# Patient Record
Sex: Female | Born: 1990 | Race: Asian | Hispanic: No | Marital: Single | Smoking: Never smoker
Health system: Southern US, Community
[De-identification: ages and names within clinical notes are randomized; demographics above are authoritative.]

## PROBLEM LIST (undated history)

## (undated) DIAGNOSIS — Z789 Other specified health status: Secondary | ICD-10-CM

## (undated) HISTORY — PX: NO PAST SURGERIES: SHX2092

---

## 2012-06-07 LAB — OB RESULTS CONSOLE ABO/RH: RH TYPE: POSITIVE

## 2012-06-07 LAB — OB RESULTS CONSOLE VARICELLA ZOSTER ANTIBODY, IGG: VARICELLA IGG: IMMUNE

## 2014-03-21 NOTE — L&D Delivery Note (Cosign Needed)
Delivery Note   The patient was admitted 11/19 complaining of PROM 11/18 at approximately 10:00 AM. Augmentation with pitocin. No signs/symptoms triple I. Pushed for approximately 30 minutes. Category 1 fetal heart tracing essentially throughout. At 1:58 PM a viable female was delivered via Vaginal, Spontaneous Delivery (Presentation: Left Occiput Anterior).  APGAR: 8, 9; weight 3187 g.   Placenta status: Intact, Spontaneous.  Cord: 3 vessels with the following complications: trailing membrane manually removed.  Cord pH: not obtained  Anesthesia: Epidural  Episiotomy: None Lacerations: 2nd degree (partial) Suture Repair: 3.0 vicryl Est. Blood Loss (mL):  300 ml  Mom to postpartum.  Baby to Couplet care / Skin to Skin.  Karen Smith 02/07/2015, 2:25 PM

## 2014-06-18 LAB — OB RESULTS CONSOLE HGB/HCT, BLOOD
HEMATOCRIT: 35 %
HEMOGLOBIN: 11.5 g/dL

## 2014-07-14 LAB — OB RESULTS CONSOLE RPR: RPR: NONREACTIVE

## 2014-07-14 LAB — OB RESULTS CONSOLE GC/CHLAMYDIA
Chlamydia: NEGATIVE
Gonorrhea: NEGATIVE

## 2014-07-14 LAB — OB RESULTS CONSOLE ANTIBODY SCREEN: ANTIBODY SCREEN: NEGATIVE

## 2014-07-14 LAB — OB RESULTS CONSOLE HEPATITIS B SURFACE ANTIGEN: Hepatitis B Surface Ag: NEGATIVE

## 2014-07-14 LAB — CULTURE, OB URINE: URINE CULTURE, OB: NEGATIVE

## 2014-07-14 LAB — CYTOLOGY - PAP: CYTOLOGY - PAP: NEGATIVE

## 2014-07-14 LAB — OB RESULTS CONSOLE RUBELLA ANTIBODY, IGM: Rubella: IMMUNE

## 2014-08-11 LAB — AFP MATERNAL TRIPLE SCREEN: AFP, Serum: NEGATIVE

## 2014-08-11 LAB — OB RESULTS CONSOLE HIV ANTIBODY (ROUTINE TESTING): HIV: NONREACTIVE

## 2014-08-11 LAB — SICKLE CELL SCREEN

## 2014-11-17 ENCOUNTER — Encounter: Payer: Self-pay | Admitting: *Deleted

## 2014-11-17 ENCOUNTER — Ambulatory Visit (INDEPENDENT_AMBULATORY_CARE_PROVIDER_SITE_OTHER): Payer: Medicaid Other | Admitting: Family Medicine

## 2014-11-17 ENCOUNTER — Encounter: Payer: Self-pay | Admitting: Family Medicine

## 2014-11-17 VITALS — BP 118/69 | HR 87 | Temp 98.5°F | Ht <= 58 in | Wt 117.3 lb

## 2014-11-17 DIAGNOSIS — O09629 Supervision of young multigravida, unspecified trimester: Secondary | ICD-10-CM | POA: Insufficient documentation

## 2014-11-17 DIAGNOSIS — Z3493 Encounter for supervision of normal pregnancy, unspecified, third trimester: Secondary | ICD-10-CM

## 2014-11-17 DIAGNOSIS — O09622 Supervision of young multigravida, second trimester: Secondary | ICD-10-CM | POA: Diagnosis not present

## 2014-11-17 DIAGNOSIS — Z23 Encounter for immunization: Secondary | ICD-10-CM | POA: Diagnosis not present

## 2014-11-17 DIAGNOSIS — IMO0002 Reserved for concepts with insufficient information to code with codable children: Secondary | ICD-10-CM | POA: Insufficient documentation

## 2014-11-17 DIAGNOSIS — Q897 Multiple congenital malformations, not elsewhere classified: Secondary | ICD-10-CM

## 2014-11-17 LAB — CBC
HEMATOCRIT: 32.7 % — AB (ref 36.0–46.0)
HEMOGLOBIN: 10.7 g/dL — AB (ref 12.0–15.0)
MCH: 23.2 pg — ABNORMAL LOW (ref 26.0–34.0)
MCHC: 32.7 g/dL (ref 30.0–36.0)
MCV: 70.8 fL — ABNORMAL LOW (ref 78.0–100.0)
Platelets: 169 10*3/uL (ref 150–400)
RBC: 4.62 MIL/uL (ref 3.87–5.11)
RDW: 14 % (ref 11.5–15.5)
WBC: 12.4 10*3/uL — AB (ref 4.0–10.5)

## 2014-11-17 LAB — POCT URINALYSIS DIP (DEVICE)
Bilirubin Urine: NEGATIVE
Glucose, UA: NEGATIVE mg/dL
HGB URINE DIPSTICK: NEGATIVE
Ketones, ur: NEGATIVE mg/dL
Leukocytes, UA: NEGATIVE
NITRITE: NEGATIVE
PH: 7 (ref 5.0–8.0)
PROTEIN: NEGATIVE mg/dL
Specific Gravity, Urine: 1.015 (ref 1.005–1.030)
Urobilinogen, UA: 0.2 mg/dL (ref 0.0–1.0)

## 2014-11-17 LAB — GLUCOSE TOLERANCE, 1 HOUR (50G) W/O FASTING: Glucose, 1 Hour GTT: 106 mg/dL (ref 70–140)

## 2014-11-17 MED ORDER — TETANUS-DIPHTH-ACELL PERTUSSIS 5-2.5-18.5 LF-MCG/0.5 IM SUSP
0.5000 mL | Freq: Once | INTRAMUSCULAR | Status: AC
  Administered 2014-11-17: 0.5 mL via INTRAMUSCULAR

## 2014-11-17 NOTE — Progress Notes (Signed)
Initial OB appointment Flu vaccine/tdap vaccine 28 wks labs with 1 hour gtt New/28 wk educational material given

## 2014-11-17 NOTE — Patient Instructions (Signed)
Third Trimester of Pregnancy The third trimester is from week 29 through week 42, months 7 through 9. The third trimester is a time when the fetus is growing rapidly. At the end of the ninth month, the fetus is about 20 inches in length and weighs 6-10 pounds.  BODY CHANGES Your body goes through many changes during pregnancy. The changes vary from woman to woman.   Your weight will continue to increase. You can expect to gain 25-35 pounds (11-16 kg) by the end of the pregnancy.  You may begin to get stretch marks on your hips, abdomen, and breasts.  You may urinate more often because the fetus is moving lower into your pelvis and pressing on your bladder.  You may develop or continue to have heartburn as a result of your pregnancy.  You may develop constipation because certain hormones are causing the muscles that push waste through your intestines to slow down.  You may develop hemorrhoids or swollen, bulging veins (varicose veins).  You may have pelvic pain because of the weight gain and pregnancy hormones relaxing your joints between the bones in your pelvis. Backaches may result from overexertion of the muscles supporting your posture.  You may have changes in your hair. These can include thickening of your hair, rapid growth, and changes in texture. Some women also have hair loss during or after pregnancy, or hair that feels dry or thin. Your hair will most likely return to normal after your baby is born.  Your breasts will continue to grow and be tender. A yellow discharge may leak from your breasts called colostrum.  Your belly button may stick out.  You may feel short of breath because of your expanding uterus.  You may notice the fetus "dropping," or moving lower in your abdomen.  You may have a bloody mucus discharge. This usually occurs a few days to a week before labor begins.  Your cervix becomes thin and soft (effaced) near your due date. WHAT TO EXPECT AT YOUR PRENATAL  EXAMS  You will have prenatal exams every 2 weeks until week 36. Then, you will have weekly prenatal exams. During a routine prenatal visit:  You will be weighed to make sure you and the fetus are growing normally.  Your blood pressure is taken.  Your abdomen will be measured to track your baby's growth.  The fetal heartbeat will be listened to.  Any test results from the previous visit will be discussed.  You may have a cervical check near your due date to see if you have effaced. At around 36 weeks, your caregiver will check your cervix. At the same time, your caregiver will also perform a test on the secretions of the vaginal tissue. This test is to determine if a type of bacteria, Group B streptococcus, is present. Your caregiver will explain this further. Your caregiver may ask you:  What your birth plan is.  How you are feeling.  If you are feeling the baby move.  If you have had any abnormal symptoms, such as leaking fluid, bleeding, severe headaches, or abdominal cramping.  If you have any questions. Other tests or screenings that may be performed during your third trimester include:  Blood tests that check for low iron levels (anemia).  Fetal testing to check the health, activity level, and growth of the fetus. Testing is done if you have certain medical conditions or if there are problems during the pregnancy. FALSE LABOR You may feel small, irregular contractions that   eventually go away. These are called Braxton Hicks contractions, or false labor. Contractions may last for hours, days, or even weeks before true labor sets in. If contractions come at regular intervals, intensify, or become painful, it is best to be seen by your caregiver.  SIGNS OF LABOR   Menstrual-like cramps.  Contractions that are 5 minutes apart or less.  Contractions that start on the top of the uterus and spread down to the lower abdomen and back.  A sense of increased pelvic pressure or back  pain.  A watery or bloody mucus discharge that comes from the vagina. If you have any of these signs before the 37th week of pregnancy, call your caregiver right away. You need to go to the hospital to get checked immediately. HOME CARE INSTRUCTIONS   Avoid all smoking, herbs, alcohol, and unprescribed drugs. These chemicals affect the formation and growth of the baby.  Follow your caregiver's instructions regarding medicine use. There are medicines that are either safe or unsafe to take during pregnancy.  Exercise only as directed by your caregiver. Experiencing uterine cramps is a good sign to stop exercising.  Continue to eat regular, healthy meals.  Wear a good support bra for breast tenderness.  Do not use hot tubs, steam rooms, or saunas.  Wear your seat belt at all times when driving.  Avoid raw meat, uncooked cheese, cat litter boxes, and soil used by cats. These carry germs that can cause birth defects in the baby.  Take your prenatal vitamins.  Try taking a stool softener (if your caregiver approves) if you develop constipation. Eat more high-fiber foods, such as fresh vegetables or fruit and whole grains. Drink plenty of fluids to keep your urine clear or pale yellow.  Take warm sitz baths to soothe any pain or discomfort caused by hemorrhoids. Use hemorrhoid cream if your caregiver approves.  If you develop varicose veins, wear support hose. Elevate your feet for 15 minutes, 3-4 times a day. Limit salt in your diet.  Avoid heavy lifting, wear low heal shoes, and practice good posture.  Rest a lot with your legs elevated if you have leg cramps or low back pain.  Visit your dentist if you have not gone during your pregnancy. Use a soft toothbrush to brush your teeth and be gentle when you floss.  A sexual relationship may be continued unless your caregiver directs you otherwise.  Do not travel far distances unless it is absolutely necessary and only with the approval  of your caregiver.  Take prenatal classes to understand, practice, and ask questions about the labor and delivery.  Make a trial run to the hospital.  Pack your hospital bag.  Prepare the baby's nursery.  Continue to go to all your prenatal visits as directed by your caregiver. SEEK MEDICAL CARE IF:  You are unsure if you are in labor or if your water has broken.  You have dizziness.  You have mild pelvic cramps, pelvic pressure, or nagging pain in your abdominal area.  You have persistent nausea, vomiting, or diarrhea.  You have a bad smelling vaginal discharge.  You have pain with urination. SEEK IMMEDIATE MEDICAL CARE IF:   You have a fever.  You are leaking fluid from your vagina.  You have spotting or bleeding from your vagina.  You have severe abdominal cramping or pain.  You have rapid weight loss or gain.  You have shortness of breath with chest pain.  You notice sudden or extreme swelling   of your face, hands, ankles, feet, or legs.  You have not felt your baby move in over an hour.  You have severe headaches that do not go away with medicine.  You have vision changes. Document Released: 03/01/2001 Document Revised: 03/12/2013 Document Reviewed: 05/08/2012 Exit[employer] Patient Information 2015 [employer]. This information is not intended to replace advice given to you by your health care provider. Make sure you discuss any questions you have with your health care provider.  

## 2014-11-17 NOTE — Progress Notes (Signed)
Ultrasound scheduled for 12/15/2014 @ 10:00 AM with MFM

## 2014-11-17 NOTE — Progress Notes (Signed)
Subjective:  Karen Smith is a 24 y.o. G2P1001 at [redacted]w[redacted]d being seen today for ongoing prenatal care.  Patient reports no complaints.  Contractions: Not present.  Vag. Bleeding: None. Denies leaking of fluid.   The following portions of the patient's history were reviewed and updated as appropriate: allergies, current medications, past family history, past medical history, past social history, past surgical history and problem list.   Objective:   Filed Vitals:   11/17/14 0824 11/17/14 0905  BP: 118/69   Pulse: 87   Temp: 98.5 F (36.9 C)   Height:   (1.448 m)  Weight: 117 lb 4.8 oz (53.207 kg)     Fetal Status: Fetal Heart Rate (bpm): 156 Fundal Height: 29 cm       General:  Alert, oriented and cooperative. Patient is in no acute distress.  Skin: Skin is warm and dry. No rash noted.   Cardiovascular: Normal heart rate noted  Respiratory: Normal respiratory effort, no problems with respiration noted  Abdomen: Soft, gravid, appropriate for gestational age. Pain/Pressure: Absent     Pelvic: Vag. Bleeding: None     Cervical exam deferred        Extremities: Normal range of motion.  Edema: None  Mental Status: Normal mood and affect. Normal behavior. Normal judgment and thought content.   Urinalysis: Urine Protein: Negative Urine Glucose: Negative  Assessment and Plan:  Pregnancy: G2P1001 at [redacted]w[redacted]d  1. Prenatal care in third trimester - Glucose Tolerance, 1 HR (50g) w/o Fasting - CBC - RPR - HIV antibody (with reflex)  2. Flu vaccine need - Flu Vaccine QUAD 36+ mos IM; Standing - Flu Vaccine QUAD 36+ mos IM  3. Need for Tdap vaccination - Tdap (BOOSTRIX) injection 0.5 mL; Inject 0.5 mLs into the muscle once.  4. Supervision of young multigravida, antepartum, third trimester Added pregnancy box Reviewed PTL signs and sx   5. Concern for microcephaly- noted by previous provider based on HC 24.52 and AC=22.36. Of note mother is 4'9" and father is 56"3 and last pregnancy  infant was 5#10oz - Ordered Level II follow up US in 4 weeks  Preterm labor symptoms and general obstetric precautions including but not limited to vaginal bleeding, contractions, leaking of fluid and fetal movement were reviewed in detail with the patient. Please refer to After Visit Summary for other counseling recommendations.  Return in about 2 weeks (around 12/01/2014) for Routine prenatal care.   Federico Flake, MD  Future Appointments Date Time Provider Department Center  12/01/2014 9:30 AM Kathrynn Running, MD [facility]  12/15/2014 10:00 AM [facility] [place] 1 [facility]

## 2014-11-17 NOTE — Progress Notes (Signed)
Nutrition Note: 1st visit consult Pt has gained 17.3# @ [redacted]w[redacted]d, which is wnl.  Pt reports eating 4-5 meal/snacks daily.  Pt is taking PNV.  Pt reports no N & V or heartburn. NKFA.  Pt received verbal and written education on general nutrition during pregnancy. Discussed weight gain goals of 25-35 lbs or 1 lb per week. Pt agrees to continue to take PNV. Pt has WIC.  F/u as needed. Lupe Carney Matznick MS,RD,LDN

## 2014-11-18 LAB — RPR

## 2014-11-18 LAB — HIV ANTIBODY (ROUTINE TESTING W REFLEX): HIV: NONREACTIVE

## 2014-12-01 ENCOUNTER — Ambulatory Visit (INDEPENDENT_AMBULATORY_CARE_PROVIDER_SITE_OTHER): Payer: Medicaid Other | Admitting: Obstetrics and Gynecology

## 2014-12-01 VITALS — BP 125/65 | HR 92 | Temp 98.1°F | Wt 119.2 lb

## 2014-12-01 DIAGNOSIS — Z3493 Encounter for supervision of normal pregnancy, unspecified, third trimester: Secondary | ICD-10-CM | POA: Diagnosis not present

## 2014-12-01 DIAGNOSIS — IMO0002 Reserved for concepts with insufficient information to code with codable children: Secondary | ICD-10-CM

## 2014-12-01 DIAGNOSIS — O09623 Supervision of young multigravida, third trimester: Secondary | ICD-10-CM

## 2014-12-01 LAB — POCT URINALYSIS DIP (DEVICE)
Bilirubin Urine: NEGATIVE
Glucose, UA: NEGATIVE mg/dL
HGB URINE DIPSTICK: NEGATIVE
KETONES UR: NEGATIVE mg/dL
Leukocytes, UA: NEGATIVE
Nitrite: NEGATIVE
PH: 7 (ref 5.0–8.0)
PROTEIN: NEGATIVE mg/dL
Specific Gravity, Urine: 1.02 (ref 1.005–1.030)
Urobilinogen, UA: 0.2 mg/dL (ref 0.0–1.0)

## 2014-12-01 NOTE — Progress Notes (Signed)
Subjective:  Karen Smith is a 24 y.o. G2P1001 at [redacted]w[redacted]d being seen today for ongoing prenatal care.  Patient reports no complaints.  Contractions: Not present.  Vag. Bleeding: None. Movement: Present. Denies leaking of fluid.   The following portions of the patient's history were reviewed and updated as appropriate: allergies, current medications, past family history, past medical history, past social history, past surgical history and problem list.   Objective:   Filed Vitals:   12/01/14 1006  BP: 125/65  Pulse: 92  Temp: 98.1 F (36.7 C)  Weight: 119 lb 3.2 oz (54.069 kg)    Fetal Status: Fetal Heart Rate (bpm): 143 Fundal Height: 29 cm Movement: Present     General:  Alert, oriented and cooperative. Patient is in no acute distress.  Skin: Skin is warm and dry. No rash noted.   Cardiovascular: Normal heart rate noted  Respiratory: Normal respiratory effort, no problems with respiration noted  Abdomen: Soft, gravid, appropriate for gestational age. Pain/Pressure: Absent     Pelvic: Vag. Bleeding: None     Cervical exam deferred        Extremities: Normal range of motion.  Edema: None  Mental Status: Normal mood and affect. Normal behavior. Normal judgment and thought content.   Urinalysis: Urine Protein: Negative Urine Glucose: Negative  Assessment and Plan:  Pregnancy: G2P1001 at [redacted]w[redacted]d  1. Supervision of young multigravida, antepartum, third trimester - continue pnv  # Concern for microcephaly - u/s scheduled for 9/26  Preterm labor symptoms and general obstetric precautions including but not limited to vaginal bleeding, contractions, leaking of fluid and fetal movement were reviewed in detail with the patient. Please refer to After Visit Summary for other counseling recommendations.  Return in about 2 weeks (around 12/15/2014).   Kathrynn Running, MD

## 2014-12-01 NOTE — Progress Notes (Signed)
Reviewed tip of week with patient  

## 2014-12-15 ENCOUNTER — Encounter (HOSPITAL_COMMUNITY): Payer: Self-pay

## 2014-12-15 ENCOUNTER — Other Ambulatory Visit: Payer: Self-pay | Admitting: Family Medicine

## 2014-12-15 ENCOUNTER — Ambulatory Visit (INDEPENDENT_AMBULATORY_CARE_PROVIDER_SITE_OTHER): Payer: Medicaid Other | Admitting: Obstetrics & Gynecology

## 2014-12-15 ENCOUNTER — Ambulatory Visit (HOSPITAL_COMMUNITY)
Admission: RE | Admit: 2014-12-15 | Discharge: 2014-12-15 | Disposition: A | Discharge status: Home / Self Care | Payer: Medicaid Other | Source: Ambulatory Visit | Attending: Family Medicine | Admitting: Family Medicine

## 2014-12-15 VITALS — BP 120/79 | HR 87 | Wt 124.4 lb

## 2014-12-15 VITALS — BP 122/74 | HR 84 | Temp 98.5°F | Wt 123.4 lb

## 2014-12-15 DIAGNOSIS — Z0489 Encounter for examination and observation for other specified reasons: Secondary | ICD-10-CM

## 2014-12-15 DIAGNOSIS — Z3A33 33 weeks gestation of pregnancy: Secondary | ICD-10-CM | POA: Insufficient documentation

## 2014-12-15 DIAGNOSIS — O350XX0 Maternal care for (suspected) central nervous system malformation in fetus, not applicable or unspecified: Secondary | ICD-10-CM | POA: Diagnosis not present

## 2014-12-15 DIAGNOSIS — Z36 Encounter for antenatal screening of mother: Secondary | ICD-10-CM | POA: Insufficient documentation

## 2014-12-15 DIAGNOSIS — O09623 Supervision of young multigravida, third trimester: Secondary | ICD-10-CM

## 2014-12-15 DIAGNOSIS — O289 Unspecified abnormal findings on antenatal screening of mother: Secondary | ICD-10-CM

## 2014-12-15 DIAGNOSIS — O283 Abnormal ultrasonic finding on antenatal screening of mother: Secondary | ICD-10-CM | POA: Insufficient documentation

## 2014-12-15 DIAGNOSIS — IMO0001 Reserved for inherently not codable concepts without codable children: Secondary | ICD-10-CM

## 2014-12-15 DIAGNOSIS — Z3689 Encounter for other specified antenatal screening: Secondary | ICD-10-CM

## 2014-12-15 DIAGNOSIS — IMO0002 Reserved for concepts with insufficient information to code with codable children: Secondary | ICD-10-CM

## 2014-12-15 LAB — POCT URINALYSIS DIP (DEVICE)
BILIRUBIN URINE: NEGATIVE
Glucose, UA: NEGATIVE mg/dL
HGB URINE DIPSTICK: NEGATIVE
KETONES UR: NEGATIVE mg/dL
LEUKOCYTES UA: NEGATIVE
NITRITE: NEGATIVE
PH: 7.5 (ref 5.0–8.0)
Protein, ur: NEGATIVE mg/dL
SPECIFIC GRAVITY, URINE: 1.015 (ref 1.005–1.030)
Urobilinogen, UA: 0.2 mg/dL (ref 0.0–1.0)

## 2014-12-15 NOTE — Progress Notes (Signed)
Subjective:  Karen Smith is a 24 y.o. G2P1001 at [redacted]w[redacted]d being seen today for ongoing prenatal care.  Patient reports backache.  Contractions: Not present.  Vag. Bleeding: None. Movement: Present. Denies leaking of fluid.   The following portions of the patient's history were reviewed and updated as appropriate: allergies, current medications, past family history, past medical history, past social history, past surgical history and problem list.   Objective:   Filed Vitals:   12/15/14 1156  BP: 122/74  Pulse: 84  Temp: 98.5 F (36.9 C)  Weight: 123 lb 6.4 oz (55.974 kg)    Fetal Status: Fetal Heart Rate (bpm): 146 Fundal Height: 32 cm Movement: Present     General:  Alert, oriented and cooperative. Patient is in no acute distress.  Skin: Skin is warm and dry. No rash noted.   Cardiovascular: Normal heart rate noted  Respiratory: Normal respiratory effort, no problems with respiration noted  Abdomen: Soft, gravid, appropriate for gestational age. Pain/Pressure: Present     Pelvic: Vag. Bleeding: None     Cervical exam deferred        Extremities: Normal range of motion.  Edema: None  Mental Status: Normal mood and affect. Normal behavior. Normal judgment and thought content.   Urinalysis: Urine Protein: Negative Urine Glucose: Negative  Assessment and Plan:  Pregnancy: G2P1001 at [redacted]w[redacted]d  1. Fetal anomaly micocephaly--F/U US in 3 weeks  2. Supervision of young multigravida, antepartum, third trimester Contraception encouraged  Preterm labor symptoms and general obstetric precautions including but not limited to vaginal bleeding, contractions, leaking of fluid and fetal movement were reviewed in detail with the patient. Please refer to After Visit Summary for other counseling recommendations.  Return in about 2 weeks (around 12/29/2014).   Lesly Dukes, MD

## 2014-12-15 NOTE — Patient Instructions (Signed)
Levonorgestrel intrauterine device (IUD) What is this medicine? LEVONORGESTREL IUD (LEE voe nor jes trel) is a contraceptive (birth control) device. The device is placed inside the uterus by a healthcare professional. It is used to prevent pregnancy and can also be used to treat heavy bleeding that occurs during your period. Depending on the device, it can be used for 3 to 5 years. This medicine may be used for other purposes; ask your health care provider or pharmacist if you have questions. COMMON BRAND NAME(S): LILETTA, Mirena, Skyla What should I tell my health care provider before I take this medicine? They need to know if you have any of these conditions: -abnormal Pap smear -cancer of the breast, uterus, or cervix -diabetes -endometritis -genital or pelvic infection now or in the past -have more than one sexual partner or your partner has more than one partner -heart disease -history of an ectopic or tubal pregnancy -immune system problems -IUD in place -liver disease or tumor -problems with blood clots or take blood-thinners -use intravenous drugs -uterus of unusual shape -vaginal bleeding that has not been explained -an unusual or allergic reaction to levonorgestrel, other hormones, silicone, or polyethylene, medicines, foods, dyes, or preservatives -pregnant or trying to get pregnant -breast-feeding How should I use this medicine? This device is placed inside the uterus by a health care professional. Talk to your pediatrician regarding the use of this medicine in children. Special care may be needed. Overdosage: If you think you have taken too much of this medicine contact a poison control center or emergency room at once. NOTE: This medicine is only for you. Do not share this medicine with others. What if I miss a dose? This does not apply. What may interact with this medicine? Do not take this medicine with any of the following  medications: -amprenavir -bosentan -fosamprenavir This medicine may also interact with the following medications: -aprepitant -barbiturate medicines for inducing sleep or treating seizures -bexarotene -griseofulvin -medicines to treat seizures like carbamazepine, ethotoin, felbamate, oxcarbazepine, phenytoin, topiramate -modafinil -pioglitazone -rifabutin -rifampin -rifapentine -some medicines to treat HIV infection like atazanavir, indinavir, lopinavir, nelfinavir, tipranavir, ritonavir -St. John's wort -warfarin This list may not describe all possible interactions. Give your health care provider a list of all the medicines, herbs, non-prescription drugs, or dietary supplements you use. Also tell them if you smoke, drink alcohol, or use illegal drugs. Some items may interact with your medicine. What should I watch for while using this medicine? Visit your doctor or health care professional for regular check ups. See your doctor if you or your partner has sexual contact with others, becomes HIV positive, or gets a sexual transmitted disease. This product does not protect you against HIV infection (AIDS) or other sexually transmitted diseases. You can check the placement of the IUD yourself by reaching up to the top of your vagina with clean fingers to feel the threads. Do not pull on the threads. It is a good habit to check placement after each menstrual period. Call your doctor right away if you feel more of the IUD than just the threads or if you cannot feel the threads at all. The IUD may come out by itself. You may become pregnant if the device comes out. If you notice that the IUD has come out use a backup birth control method like condoms and call your health care provider. Using tampons will not change the position of the IUD and are okay to use during your period. What side effects may   I notice from receiving this medicine? Side effects that you should report to your doctor or  health care professional as soon as possible: -allergic reactions like skin rash, itching or hives, swelling of the face, lips, or tongue -fever, flu-like symptoms -genital sores -high blood pressure -no menstrual period for 6 weeks during use -pain, swelling, warmth in the leg -pelvic pain or tenderness -severe or sudden headache -signs of pregnancy -stomach cramping -sudden shortness of breath -trouble with balance, talking, or walking -unusual vaginal bleeding, discharge -yellowing of the eyes or skin Side effects that usually do not require medical attention (report to your doctor or health care professional if they continue or are bothersome): -acne -breast pain -change in sex drive or performance -changes in weight -cramping, dizziness, or faintness while the device is being inserted -headache -irregular menstrual bleeding within first 3 to 6 months of use -nausea This list may not describe all possible side effects. Call your doctor for medical advice about side effects. You may report side effects to FDA at 1-800-FDA-1088. Where should I keep my medicine? This does not apply. NOTE: This sheet is a summary. It may not cover all possible information. If you have questions about this medicine, talk to your doctor, pharmacist, or health care provider.  2015, [employer]. (2011-04-07 13:54:04)  

## 2014-12-15 NOTE — Progress Notes (Signed)
C/o pressure that she has not been having, especially when lying down, denies contractions.

## 2014-12-29 ENCOUNTER — Ambulatory Visit (INDEPENDENT_AMBULATORY_CARE_PROVIDER_SITE_OTHER): Payer: Medicaid Other | Admitting: Obstetrics & Gynecology

## 2014-12-29 ENCOUNTER — Encounter: Payer: Self-pay | Admitting: Obstetrics & Gynecology

## 2014-12-29 VITALS — BP 119/68 | HR 80 | Temp 97.8°F | Wt 125.1 lb

## 2014-12-29 DIAGNOSIS — O09623 Supervision of young multigravida, third trimester: Secondary | ICD-10-CM | POA: Diagnosis present

## 2014-12-29 LAB — POCT URINALYSIS DIP (DEVICE)
Bilirubin Urine: NEGATIVE
Glucose, UA: NEGATIVE mg/dL
HGB URINE DIPSTICK: NEGATIVE
Ketones, ur: NEGATIVE mg/dL
Leukocytes, UA: NEGATIVE
Nitrite: NEGATIVE
PH: 6 (ref 5.0–8.0)
PROTEIN: NEGATIVE mg/dL
SPECIFIC GRAVITY, URINE: 1.015 (ref 1.005–1.030)
UROBILINOGEN UA: 0.2 mg/dL (ref 0.0–1.0)

## 2014-12-29 NOTE — Progress Notes (Signed)
Subjective:wasnt sure of her LMP  Karen Smith is a 24 y.o. G2P1001 at [redacted]w[redacted]d being seen today for ongoing prenatal care.  Patient reports no complaints.  Contractions: Not present.  Vag. Bleeding: None. Movement: Present. Denies leaking of fluid.   The following portions of the patient's history were reviewed and updated as appropriate: allergies, current medications, past family history, past medical history, past social history, past surgical history and problem list.   Objective:   Filed Vitals:   12/29/14 1103  BP: 119/68  Pulse: 80  Temp: 97.8 F (36.6 C)  Weight: 125 lb 1.6 oz (56.745 kg)    Fetal Status: Fetal Heart Rate (bpm): 144   Movement: Present     General:  Alert, oriented and cooperative. Patient is in no acute distress.  Skin: Skin is warm and dry. No rash noted.   Cardiovascular: Normal heart rate noted  Respiratory: Normal respiratory effort, no problems with respiration noted  Abdomen: Soft, gravid, appropriate for gestational age. Pain/Pressure: Absent     Pelvic: Vag. Bleeding: None     Cervical exam deferred        Extremities: Normal range of motion.  Edema: None  Mental Status: Normal mood and affect. Normal behavior. Normal judgment and thought content.   Urinalysis:      Assessment and Plan:  Pregnancy: G2P1001 at [redacted]w[redacted]d  1. Supervision of young multigravida, antepartum, third trimester Doing well   Preterm labor symptoms and general obstetric precautions including but not limited to vaginal bleeding, contractions, leaking of fluid and fetal movement were reviewed in detail with the patient. Please refer to After Visit Summary for other counseling recommendations.  Return in about 1 week (around 01/05/2015).   Adam Phenix, MD

## 2014-12-29 NOTE — Progress Notes (Signed)
Used Interpreter [employer].

## 2014-12-29 NOTE — Patient Instructions (Addendum)

## 2015-01-05 ENCOUNTER — Other Ambulatory Visit (HOSPITAL_COMMUNITY): Payer: Self-pay | Admitting: Maternal and Fetal Medicine

## 2015-01-05 ENCOUNTER — Ambulatory Visit (INDEPENDENT_AMBULATORY_CARE_PROVIDER_SITE_OTHER): Payer: Medicaid Other | Admitting: Obstetrics and Gynecology

## 2015-01-05 ENCOUNTER — Encounter (HOSPITAL_COMMUNITY): Payer: Self-pay

## 2015-01-05 ENCOUNTER — Other Ambulatory Visit (HOSPITAL_COMMUNITY): Payer: Self-pay | Admitting: *Deleted

## 2015-01-05 ENCOUNTER — Ambulatory Visit (HOSPITAL_COMMUNITY)
Admission: RE | Admit: 2015-01-05 | Discharge: 2015-01-05 | Disposition: A | Discharge status: Home / Self Care | Payer: Medicaid Other | Source: Ambulatory Visit | Attending: Family Medicine | Admitting: Family Medicine

## 2015-01-05 VITALS — BP 119/88 | HR 82 | Temp 98.1°F | Wt 126.9 lb

## 2015-01-05 DIAGNOSIS — Z3A35 35 weeks gestation of pregnancy: Secondary | ICD-10-CM

## 2015-01-05 DIAGNOSIS — O3507X0 Maternal care for (suspected) central nervous system malformation or damage in fetus, microcephaly, not applicable or unspecified: Secondary | ICD-10-CM

## 2015-01-05 DIAGNOSIS — Z0489 Encounter for examination and observation for other specified reasons: Secondary | ICD-10-CM

## 2015-01-05 DIAGNOSIS — Z36 Encounter for antenatal screening of mother: Secondary | ICD-10-CM | POA: Diagnosis present

## 2015-01-05 DIAGNOSIS — Q897 Multiple congenital malformations, not elsewhere classified: Secondary | ICD-10-CM

## 2015-01-05 DIAGNOSIS — IMO0002 Reserved for concepts with insufficient information to code with codable children: Secondary | ICD-10-CM

## 2015-01-05 DIAGNOSIS — O365930 Maternal care for other known or suspected poor fetal growth, third trimester, not applicable or unspecified: Secondary | ICD-10-CM

## 2015-01-05 DIAGNOSIS — O283 Abnormal ultrasonic finding on antenatal screening of mother: Secondary | ICD-10-CM | POA: Insufficient documentation

## 2015-01-05 DIAGNOSIS — O09623 Supervision of young multigravida, third trimester: Secondary | ICD-10-CM | POA: Diagnosis present

## 2015-01-05 DIAGNOSIS — O289 Unspecified abnormal findings on antenatal screening of mother: Secondary | ICD-10-CM

## 2015-01-05 DIAGNOSIS — O350XX0 Maternal care for (suspected) central nervous system malformation in fetus, not applicable or unspecified: Secondary | ICD-10-CM | POA: Diagnosis not present

## 2015-01-05 LAB — POCT URINALYSIS DIP (DEVICE)
Bilirubin Urine: NEGATIVE
GLUCOSE, UA: NEGATIVE mg/dL
Hgb urine dipstick: NEGATIVE
Ketones, ur: NEGATIVE mg/dL
Leukocytes, UA: NEGATIVE
NITRITE: NEGATIVE
PH: 7.5 (ref 5.0–8.0)
PROTEIN: NEGATIVE mg/dL
Specific Gravity, Urine: 1.02 (ref 1.005–1.030)
Urobilinogen, UA: 0.2 mg/dL (ref 0.0–1.0)

## 2015-01-05 NOTE — Progress Notes (Signed)
Subjective:  Karen Smith is a 24 y.o. G2P1001 at 5891w0d being seen today for ongoing prenatal care.  Patient reports no complaints.  Contractions: Not present.  Vag. Bleeding: None. Movement: Present. Denies leaking of fluid.   The following portions of the patient's history were reviewed and updated as appropriate: allergies, current medications, past family history, past medical history, past social history, past surgical history and problem list. Problem list updated.  Objective:   Filed Vitals:   01/05/15 0934  BP: 119/88  Pulse: 82  Temp: 98.1 F (36.7 C)  Weight: 126 lb 14.4 oz (57.561 kg)    Fetal Status: Fetal Heart Rate (bpm): 140 Fundal Height: 35 cm Movement: Present  Presentation: Vertex  General:  Alert, oriented and cooperative. Patient is in no acute distress.  Skin: Skin is warm and dry. No rash noted.   Cardiovascular: Normal heart rate noted  Respiratory: Normal respiratory effort, no problems with respiration noted  Abdomen: Soft, gravid, appropriate for gestational age. Pain/Pressure: Absent     Pelvic: Vag. Bleeding: None     Cervical exam deferred        Extremities: Normal range of motion.  Edema: None  Mental Status: Normal mood and affect. Normal behavior. Normal judgment and thought content.   Urinalysis: Urine Protein: Negative Urine Glucose: Negative  Assessment and Plan:  Pregnancy: G2P1001 at 1691w0d  # Pregnancy - gbs/gc/chlamydia next visit  # Fetal anomoly - <3rd percentile AC, possible microcephaly - f/u u/s this afternoon, patient aware  There are no diagnoses linked to this encounter. Preterm labor symptoms and general obstetric precautions including but not limited to vaginal bleeding, contractions, leaking of fluid and fetal movement were reviewed in detail with the patient. Please refer to After Visit Summary for other counseling recommendations.  Return in about 1 week (around 01/12/2015).   Kathrynn RunningNoah Smith Karen Longfield, MD

## 2015-01-12 ENCOUNTER — Other Ambulatory Visit (HOSPITAL_COMMUNITY)
Admission: RE | Admit: 2015-01-12 | Discharge: 2015-01-12 | Disposition: A | Discharge status: Home / Self Care | Payer: Medicaid Other | Source: Ambulatory Visit | Attending: Obstetrics and Gynecology | Admitting: Obstetrics and Gynecology

## 2015-01-12 ENCOUNTER — Ambulatory Visit (INDEPENDENT_AMBULATORY_CARE_PROVIDER_SITE_OTHER): Payer: Medicaid Other | Admitting: Obstetrics and Gynecology

## 2015-01-12 VITALS — BP 124/71 | HR 85 | Temp 98.6°F | Wt 128.6 lb

## 2015-01-12 DIAGNOSIS — Z3483 Encounter for supervision of other normal pregnancy, third trimester: Secondary | ICD-10-CM

## 2015-01-12 DIAGNOSIS — O09623 Supervision of young multigravida, third trimester: Secondary | ICD-10-CM

## 2015-01-12 DIAGNOSIS — Z113 Encounter for screening for infections with a predominantly sexual mode of transmission: Secondary | ICD-10-CM

## 2015-01-12 DIAGNOSIS — IMO0002 Reserved for concepts with insufficient information to code with codable children: Secondary | ICD-10-CM

## 2015-01-12 LAB — POCT URINALYSIS DIP (DEVICE)
BILIRUBIN URINE: NEGATIVE
GLUCOSE, UA: NEGATIVE mg/dL
Hgb urine dipstick: NEGATIVE
Ketones, ur: NEGATIVE mg/dL
Leukocytes, UA: NEGATIVE
NITRITE: NEGATIVE
PH: 7 (ref 5.0–8.0)
PROTEIN: NEGATIVE mg/dL
Specific Gravity, Urine: 1.015 (ref 1.005–1.030)
Urobilinogen, UA: 0.2 mg/dL (ref 0.0–1.0)

## 2015-01-12 LAB — OB RESULTS CONSOLE GBS: GBS: NEGATIVE

## 2015-01-12 NOTE — Addendum Note (Signed)
Addended by: Garret ReddishBARNES, Khari Mally M on: 01/12/2015 04:01 PM   Modules accepted: Orders

## 2015-01-12 NOTE — Progress Notes (Signed)
Subjective:  Karen Smith is a 24 y.o. G2P1001 at 1850w0d being seen today for ongoing prenatal care.  Patient reports no complaints.  Contractions: Not present.  Vag. Bleeding: None. Movement: Present. Denies leaking of fluid.   The following portions of the patient's history were reviewed and updated as appropriate: allergies, current medications, past family history, past medical history, past social history, past surgical history and problem list. Problem list updated.  Objective:   Filed Vitals:   01/12/15 0907  BP: 124/71  Pulse: 85  Temp: 98.6 F (37 C)  Weight: 128 lb 9.6 oz (58.333 kg)    Fetal Status:     Movement: Present     General:  Alert, oriented and cooperative. Patient is in no acute distress.  Skin: Skin is warm and dry. No rash noted.   Cardiovascular: Normal heart rate noted  Respiratory: Normal respiratory effort, no problems with respiration noted  Abdomen: Soft, gravid, appropriate for gestational age. Pain/Pressure: Absent     Pelvic: Vag. Bleeding: None     Cervical exam deferred        Extremities: Normal range of motion.  Edema: None  Mental Status: Normal mood and affect. Normal behavior. Normal judgment and thought content.   Urinalysis:      Assessment and Plan:  Pregnancy: G2P1001 at 9150w0d  # Microcephaly - seen again, along with femur length less than 5th percentile on 10/17 u/s. MFM recommending f/u in 4 wks if still pregnant  # Pregnancy - gbs, gc/chlamydia obtained today, nkda  There are no diagnoses linked to this encounter. Preterm labor symptoms and general obstetric precautions including but not limited to vaginal bleeding, contractions, leaking of fluid and fetal movement were reviewed in detail with the patient. Please refer to After Visit Summary for other counseling recommendations.  Return in about 1 week (around 01/19/2015).   Kathrynn RunningNoah Bedford Kyli Sorter, MD

## 2015-01-12 NOTE — Addendum Note (Signed)
Addended by: Gerome ApleyZEYFANG, LINDA L on: 01/12/2015 11:41 AM   Modules accepted: Orders

## 2015-01-12 NOTE — Addendum Note (Signed)
Addended by: Garret ReddishBARNES, Takeya Marquis M on: 01/12/2015 12:13 PM   Modules accepted: Orders

## 2015-01-13 LAB — GC/CHLAMYDIA PROBE AMP (~~LOC~~) NOT AT ARMC
Chlamydia: NEGATIVE
Neisseria Gonorrhea: NEGATIVE

## 2015-01-14 LAB — CULTURE, BETA STREP (GROUP B ONLY)

## 2015-01-19 ENCOUNTER — Encounter: Payer: Medicaid Other | Admitting: Obstetrics and Gynecology

## 2015-01-20 ENCOUNTER — Ambulatory Visit (INDEPENDENT_AMBULATORY_CARE_PROVIDER_SITE_OTHER): Payer: Medicaid Other | Admitting: Obstetrics and Gynecology

## 2015-01-20 VITALS — BP 118/78 | HR 82 | Temp 98.4°F | Wt 130.9 lb

## 2015-01-20 DIAGNOSIS — O09623 Supervision of young multigravida, third trimester: Secondary | ICD-10-CM

## 2015-01-20 LAB — POCT URINALYSIS DIP (DEVICE)
BILIRUBIN URINE: NEGATIVE
Glucose, UA: NEGATIVE mg/dL
HGB URINE DIPSTICK: NEGATIVE
Ketones, ur: NEGATIVE mg/dL
LEUKOCYTES UA: NEGATIVE
NITRITE: NEGATIVE
Protein, ur: NEGATIVE mg/dL
Specific Gravity, Urine: 1.015 (ref 1.005–1.030)
UROBILINOGEN UA: 0.2 mg/dL (ref 0.0–1.0)
pH: 7 (ref 5.0–8.0)

## 2015-01-20 NOTE — Progress Notes (Signed)
Wier used for interpreter  Discussed breastfeeding ed with patient

## 2015-01-20 NOTE — Progress Notes (Signed)
Subjective:  Karen Smith is a 24 y.o. G2P1001 at 3086w1d being seen today for ongoing prenatal care.  Patient reports no complaints.  Contractions: Not present.  Vag. Bleeding: None. Movement: Present. Denies leaking of fluid.   The following portions of the patient's history were reviewed and updated as appropriate: allergies, current medications, past family history, past medical history, past social history, past surgical history and problem list. Problem list updated.  Objective:   Filed Vitals:   01/20/15 1506  BP: 118/78  Pulse: 82  Temp: 98.4 F (36.9 C)  Weight: 130 lb 14.4 oz (59.376 kg)    Fetal Status: Fetal Heart Rate (bpm): 135   Movement: Present     General:  Alert, oriented and cooperative. Patient is in no acute distress.  Skin: Skin is warm and dry. No rash noted.   Cardiovascular: Normal heart rate noted  Respiratory: Normal respiratory effort, no problems with respiration noted  Abdomen: Soft, gravid, appropriate for gestational age. Pain/Pressure: Present     Pelvic: Vag. Bleeding: None     Cervical exam deferred        Extremities: Normal range of motion.  Edema: None  Mental Status: Normal mood and affect. Normal behavior. Normal judgment and thought content.   Urinalysis:      Assessment and Plan:  Pregnancy: G2P1001 at 4086w1d  1. Supervision of young multigravida, antepartum, third trimester - f/u one week  # concern for microcephaly - likely constitutional, has f/u u/s scheduled 11/14  Term labor symptoms and general obstetric precautions including but not limited to vaginal bleeding, contractions, leaking of fluid and fetal movement were reviewed in detail with the patient. Please refer to After Visit Summary for other counseling recommendations.  Return in about 1 week (around 01/27/2015).   Kathrynn RunningNoah Bedford Karen Moore, MD

## 2015-01-27 ENCOUNTER — Ambulatory Visit (INDEPENDENT_AMBULATORY_CARE_PROVIDER_SITE_OTHER): Payer: Medicaid Other | Admitting: Advanced Practice Midwife

## 2015-01-27 VITALS — BP 125/74 | HR 75 | Temp 98.8°F | Wt 132.6 lb

## 2015-01-27 DIAGNOSIS — O359XX1 Maternal care for (suspected) fetal abnormality and damage, unspecified, fetus 1: Secondary | ICD-10-CM

## 2015-01-27 DIAGNOSIS — O09623 Supervision of young multigravida, third trimester: Secondary | ICD-10-CM

## 2015-01-27 DIAGNOSIS — O09523 Supervision of elderly multigravida, third trimester: Secondary | ICD-10-CM | POA: Diagnosis not present

## 2015-01-27 DIAGNOSIS — O359XX0 Maternal care for (suspected) fetal abnormality and damage, unspecified, not applicable or unspecified: Secondary | ICD-10-CM | POA: Diagnosis not present

## 2015-01-27 LAB — POCT URINALYSIS DIP (DEVICE)
Bilirubin Urine: NEGATIVE
GLUCOSE, UA: NEGATIVE mg/dL
Hgb urine dipstick: NEGATIVE
KETONES UR: NEGATIVE mg/dL
Leukocytes, UA: NEGATIVE
NITRITE: NEGATIVE
PROTEIN: NEGATIVE mg/dL
Specific Gravity, Urine: 1.01 (ref 1.005–1.030)
UROBILINOGEN UA: 0.2 mg/dL (ref 0.0–1.0)
pH: 7 (ref 5.0–8.0)

## 2015-01-27 NOTE — Progress Notes (Signed)
Subjective:  Karen Smith is a 24 y.o. G2P1001 at 6073w1d being seen today for ongoing prenatal care.  Patient reports no complaints.  Contractions: Not present.   . Movement: Present. Denies leaking of fluid.   The following portions of the patient's history were reviewed and updated as appropriate: allergies, current medications, past family history, past medical history, past social history, past surgical history and problem list. Problem list updated.  Objective:   Filed Vitals:   01/27/15 1435  BP: 125/74  Pulse: 75  Temp: 98.8 F (37.1 C)  Weight: 132 lb 9.6 oz (60.147 kg)    Fetal Status: Fetal Heart Rate (bpm): 139 Fundal Height: 37 cm Movement: Present     General:  Alert, oriented and cooperative. Patient is in no acute distress.  Skin: Skin is warm and dry. No rash noted.   Cardiovascular: Normal heart rate noted  Respiratory: Normal respiratory effort, no problems with respiration noted  Abdomen: Soft, gravid, appropriate for gestational age. Pain/Pressure: Present     Pelvic:       Cervical exam deferred        Extremities: Normal range of motion.  Edema: None  Mental Status: Normal mood and affect. Normal behavior. Normal judgment and thought content.   Urinalysis: Urine Protein: Negative Urine Glucose: Negative  Assessment and Plan:  Pregnancy: G2P1001 at 8173w1d  1. Supervision of young multigravida, antepartum, third trimester   2. Suspected fetal anomaly, antepartum, fetus 1 --Microcephaly and short femur length but constitutionally small patient and FOB.  F/U ultrasound on 11/14.   Term labor symptoms and general obstetric precautions including but not limited to vaginal bleeding, contractions, leaking of fluid and fetal movement were reviewed in detail with the patient.  Please refer to After Visit Summary for other counseling recommendations.  Return in about 1 week (around 02/03/2015).   Hurshel PartyLisa A Leftwich-Kirby, CNM

## 2015-02-02 ENCOUNTER — Encounter (HOSPITAL_COMMUNITY): Payer: Self-pay

## 2015-02-02 ENCOUNTER — Ambulatory Visit (HOSPITAL_COMMUNITY)
Admission: RE | Admit: 2015-02-02 | Discharge: 2015-02-02 | Disposition: A | Discharge status: Home / Self Care | Payer: Medicaid Other | Source: Ambulatory Visit | Attending: Obstetrics and Gynecology | Admitting: Obstetrics and Gynecology

## 2015-02-02 DIAGNOSIS — O350XX0 Maternal care for (suspected) central nervous system malformation in fetus, not applicable or unspecified: Secondary | ICD-10-CM | POA: Diagnosis not present

## 2015-02-02 DIAGNOSIS — O283 Abnormal ultrasonic finding on antenatal screening of mother: Secondary | ICD-10-CM | POA: Diagnosis not present

## 2015-02-02 DIAGNOSIS — Z3A39 39 weeks gestation of pregnancy: Secondary | ICD-10-CM | POA: Diagnosis not present

## 2015-02-02 DIAGNOSIS — O365930 Maternal care for other known or suspected poor fetal growth, third trimester, not applicable or unspecified: Secondary | ICD-10-CM

## 2015-02-03 ENCOUNTER — Ambulatory Visit (INDEPENDENT_AMBULATORY_CARE_PROVIDER_SITE_OTHER): Payer: Medicaid Other | Admitting: Certified Nurse Midwife

## 2015-02-03 ENCOUNTER — Encounter: Payer: Self-pay | Admitting: Certified Nurse Midwife

## 2015-02-03 VITALS — BP 115/75 | HR 87 | Temp 98.1°F | Wt 136.5 lb

## 2015-02-03 DIAGNOSIS — O329XX0 Maternal care for malpresentation of fetus, unspecified, not applicable or unspecified: Secondary | ICD-10-CM

## 2015-02-03 DIAGNOSIS — O329XX1 Maternal care for malpresentation of fetus, unspecified, fetus 1: Secondary | ICD-10-CM

## 2015-02-03 LAB — POCT URINALYSIS DIP (DEVICE)
Bilirubin Urine: NEGATIVE
Glucose, UA: NEGATIVE mg/dL
Hgb urine dipstick: NEGATIVE
Ketones, ur: NEGATIVE mg/dL
Leukocytes, UA: NEGATIVE
Nitrite: NEGATIVE
Protein, ur: NEGATIVE mg/dL
Specific Gravity, Urine: 1.015 (ref 1.005–1.030)
Urobilinogen, UA: 0.2 mg/dL (ref 0.0–1.0)
pH: 7 (ref 5.0–8.0)

## 2015-02-03 NOTE — Progress Notes (Signed)
Subjective:  Karen Smith is a 24 y.o. G2P1001 at 2931w1d being seen today for ongoing prenatal care.  Patient reports no complaints.  Contractions: Not present.  Vag. Bleeding: None. Movement: Present. Denies leaking of fluid.   The following portions of the patient's history were reviewed and updated as appropriate: allergies, current medications, past family history, past medical history, past social history, past surgical history and problem list. Problem list updated.  Objective:   Filed Vitals:   02/03/15 1435  BP: 115/75  Pulse: 87  Temp: 98.1 F (36.7 C)  Weight: 136 lb 8 oz (61.916 kg)    Fetal Status: Fetal Heart Rate (bpm): 140   Movement: Present     General:  Alert, oriented and cooperative. Patient is in no acute distress.  Skin: Skin is warm and dry. No rash noted.   Cardiovascular: Normal heart rate noted  Respiratory: Normal respiratory effort, no problems with respiration noted  Abdomen: Soft, gravid, appropriate for gestational age. Pain/Pressure: Absent     Pelvic: Vag. Bleeding: None     Cervical exam deferred        Extremities: Normal range of motion.  Edema: None  Mental Status: Normal mood and affect. Normal behavior. Normal judgment and thought content.   Urinalysis: Urine Protein: Negative Urine Glucose: Negative  Assessment and Plan:  Pregnancy: G2P1001 at 1031w1d  There are no diagnoses linked to this encounter. Term labor symptoms and general obstetric precautions including but not limited to vaginal bleeding, contractions, leaking of fluid and fetal movement were reviewed in detail with the patient. Please refer to After Visit Summary for other counseling recommendations.  Pt is scheduled for external version on Thursday  Rhea PinkLori A Fabyan Loughmiller, CNM

## 2015-02-03 NOTE — Patient Instructions (Signed)

## 2015-02-03 NOTE — Progress Notes (Signed)
Breech presentation confirmed by bedside US today.  Pt decline external version tomorrow because she does not want C/S if unsuccessful.   She feels that the baby is not due yet. Pt desires to wait another 3-4 days. Lengthy discussion with pt, FOB and interpreter regarding baby's readiness for birth and risks of prolonging pregnancy with breech presentation.  Pt agreed to external version on 11/17.

## 2015-02-03 NOTE — Progress Notes (Signed)
Sno used for interpreter  Reviewed tip of week with patient  U/s yesterday shows baby is breech

## 2015-02-04 ENCOUNTER — Telehealth (HOSPITAL_COMMUNITY): Payer: Self-pay | Admitting: *Deleted

## 2015-02-04 NOTE — Telephone Encounter (Signed)
Preadmission screen  

## 2015-02-05 ENCOUNTER — Other Ambulatory Visit: Payer: Self-pay | Admitting: *Deleted

## 2015-02-05 ENCOUNTER — Encounter (HOSPITAL_COMMUNITY): Payer: Self-pay

## 2015-02-05 ENCOUNTER — Inpatient Hospital Stay (HOSPITAL_COMMUNITY)
Admission: RE | Admit: 2015-02-05 | Discharge: 2015-02-05 | DRG: 782 | Disposition: A | Discharge status: Home / Self Care | Payer: Medicaid Other | Source: Ambulatory Visit | Attending: Family Medicine | Admitting: Family Medicine

## 2015-02-05 VITALS — BP 122/77 | HR 89 | Temp 98.2°F | Resp 18 | Ht <= 58 in | Wt 136.0 lb

## 2015-02-05 DIAGNOSIS — O321XX0 Maternal care for breech presentation, not applicable or unspecified: Secondary | ICD-10-CM | POA: Diagnosis present

## 2015-02-05 DIAGNOSIS — IMO0002 Reserved for concepts with insufficient information to code with codable children: Secondary | ICD-10-CM

## 2015-02-05 DIAGNOSIS — Z3A39 39 weeks gestation of pregnancy: Secondary | ICD-10-CM | POA: Diagnosis not present

## 2015-02-05 DIAGNOSIS — O09623 Supervision of young multigravida, third trimester: Secondary | ICD-10-CM

## 2015-02-05 DIAGNOSIS — Z3493 Encounter for supervision of normal pregnancy, unspecified, third trimester: Secondary | ICD-10-CM

## 2015-02-05 DIAGNOSIS — O321XX1 Maternal care for breech presentation, fetus 1: Secondary | ICD-10-CM

## 2015-02-05 LAB — ABO/RH: ABO/RH(D): O POS

## 2015-02-05 LAB — CBC
HEMATOCRIT: 37.7 % (ref 36.0–46.0)
Hemoglobin: 12 g/dL (ref 12.0–15.0)
MCH: 22.8 pg — ABNORMAL LOW (ref 26.0–34.0)
MCHC: 31.8 g/dL (ref 30.0–36.0)
MCV: 71.5 fL — AB (ref 78.0–100.0)
PLATELETS: 132 10*3/uL — AB (ref 150–400)
RBC: 5.27 MIL/uL — AB (ref 3.87–5.11)
RDW: 14 % (ref 11.5–15.5)
WBC: 11.3 10*3/uL — AB (ref 4.0–10.5)

## 2015-02-05 LAB — TYPE AND SCREEN
ABO/RH(D): O POS
ANTIBODY SCREEN: NEGATIVE

## 2015-02-05 LAB — RPR: RPR Ser Ql: NONREACTIVE

## 2015-02-05 MED ORDER — LIDOCAINE HCL (PF) 1 % IJ SOLN: 30.0000 mL | INTRAMUSCULAR | Status: DC | PRN

## 2015-02-05 MED ORDER — ACETAMINOPHEN 325 MG PO TABS: 650.0000 mg | ORAL_TABLET | ORAL | Status: DC | PRN

## 2015-02-05 MED ORDER — OXYTOCIN 40 UNITS IN LACTATED RINGERS INFUSION - SIMPLE MED: 62.5000 mL/h | INTRAVENOUS | Status: DC

## 2015-02-05 MED ORDER — CITRIC ACID-SODIUM CITRATE 334-500 MG/5ML PO SOLN: 30.0000 mL | ORAL | Status: DC | PRN

## 2015-02-05 MED ORDER — ONDANSETRON HCL 4 MG/2ML IJ SOLN: 4.0000 mg | Freq: Four times a day (QID) | INTRAMUSCULAR | Status: DC | PRN

## 2015-02-05 MED ORDER — OXYTOCIN BOLUS FROM INFUSION: 500.0000 mL | INTRAVENOUS | Status: DC

## 2015-02-05 MED ORDER — TERBUTALINE SULFATE 1 MG/ML IJ SOLN
0.2500 mg | Freq: Once | INTRAMUSCULAR | Status: AC
  Administered 2015-02-05: 0.25 mg via SUBCUTANEOUS
  Filled 2015-02-05: qty 1

## 2015-02-05 MED ORDER — LACTATED RINGERS IV SOLN
INTRAVENOUS | Status: DC
  Administered 2015-02-05: 08:00:00 via INTRAVENOUS

## 2015-02-05 MED ORDER — LACTATED RINGERS IV SOLN: 500.0000 mL | INTRAVENOUS | Status: DC | PRN

## 2015-02-05 MED ORDER — PRENATAL MULTIVITAMIN CH: 1.0000 | ORAL_TABLET | Freq: Every day | ORAL | Status: DC

## 2015-02-05 MED ORDER — PRENATAL MULTIVITAMIN CH: 1.0000 | ORAL_TABLET | Freq: Every day | ORAL | Status: AC

## 2015-02-05 NOTE — H&P (Signed)
Preoperative History and Physical  Karen Smith is a 24 y.o. G2P1001 at 2643w3d here for ECV for Breech position.   No significant preoperative concerns.  Proposed procedure:  ECV   History reviewed. No pertinent past medical history. History reviewed. No pertinent past surgical history. OB History    Gravida Para Term Preterm AB TAB SAB Ectopic Multiple Living   2 1 1       1       Obstetric Comments         Patient denies any cervical dysplasia or STIs. No current facility-administered medications on file prior to encounter.   Current Outpatient Prescriptions on File Prior to Encounter  Medication Sig Dispense Refill  . Prenatal Vit-Fe Fumarate-FA (PRENATAL MULTIVITAMIN) TABS tablet Take 1 tablet by mouth daily at 12 noon.     No Known Allergies Social History:   reports that she has never smoked. She does not have any smokeless tobacco history on file. She reports that she does not drink alcohol or use illicit drugs.  Family History  Problem Relation Age of Onset  . Depression Brother     Review of Systems: Full 10 systems review of systems preformed, which were normal other than what was stated in the HPI.  PHYSICAL EXAM: Blood pressure 122/84, pulse 82, temperature 98.2 F (36.8 C), temperature source Oral, resp. rate 18, height 4\' 9"  (1.448 m), weight 136 lb (61.689 kg), last menstrual period 04/28/2014. General appearance - alert, well appearing, and in no distress Head - Normocephalic, atraumatic.  Right and left external ears normal. Eyes - EOMI.  Nonicteric.  Normal conjunctiva Neck - supple, no lymphadenopathy.  No tracheal deviation Chest - clear to auscultation, no wheezes, rales or rhonchi, symmetric air entry Heart - normal rate and regular rhythm Abdomen - soft, nontender, nondistended, no masses or organomegaly Pelvic - examination not indicated Extremities - peripheral pulses normal, no pedal edema, no clubbing or cyanosis Skin - Warm to touch. no bruises,  rashes, wounds. Neuro - Oriented x3.  Cranial nerves intact. Psych - normal thought process.  Judgement intact.  Labs: Results for orders placed or performed during the hospital encounter of 02/05/15 (from the past 336 hour(s))  CBC   Collection Time: 02/05/15  8:00 AM  Result Value Ref Range   WBC 11.3 (H) 4.0 - 10.5 K/uL   RBC 5.27 (H) 3.87 - 5.11 MIL/uL   Hemoglobin 12.0 12.0 - 15.0 g/dL   HCT 16.137.7 09.636.0 - 04.546.0 %   MCV 71.5 (L) 78.0 - 100.0 fL   MCH 22.8 (L) 26.0 - 34.0 pg   MCHC 31.8 30.0 - 36.0 g/dL   RDW 40.914.0 81.111.5 - 91.415.5 %   Platelets 132 (L) 150 - 400 K/uL  Results for orders placed or performed in visit on 02/03/15 (from the past 336 hour(s))  POCT urinalysis dip (device)   Collection Time: 02/03/15  2:37 PM  Result Value Ref Range   Glucose, UA NEGATIVE NEGATIVE mg/dL   Bilirubin Urine NEGATIVE NEGATIVE   Ketones, ur NEGATIVE NEGATIVE mg/dL   Specific Gravity, Urine 1.015 1.005 - 1.030   Hgb urine dipstick NEGATIVE NEGATIVE   pH 7.0 5.0 - 8.0   Protein, ur NEGATIVE NEGATIVE mg/dL   Urobilinogen, UA 0.2 0.0 - 1.0 mg/dL   Nitrite NEGATIVE NEGATIVE   Leukocytes, UA NEGATIVE NEGATIVE  Results for orders placed or performed in visit on 01/27/15 (from the past 336 hour(s))  POCT urinalysis dip (device)   Collection Time: 01/27/15  2:12 PM  Result Value Ref Range   Glucose, UA NEGATIVE NEGATIVE mg/dL   Bilirubin Urine NEGATIVE NEGATIVE   Ketones, ur NEGATIVE NEGATIVE mg/dL   Specific Gravity, Urine 1.010 1.005 - 1.030   Hgb urine dipstick NEGATIVE NEGATIVE   pH 7.0 5.0 - 8.0   Protein, ur NEGATIVE NEGATIVE mg/dL   Urobilinogen, UA 0.2 0.0 - 1.0 mg/dL   Nitrite NEGATIVE NEGATIVE   Leukocytes, UA NEGATIVE NEGATIVE    Imaging Studies: [place] [facility] Ob Follow Up  02/02/2015  OBSTETRICAL ULTRASOUND: This exam was performed within a [facility]. The OB US report was generated in the AS system, and faxed to the ordering physician.  This report is available  in the [employer]. See the AS Obstetric US report via the Image Link.   Assessment: Patient Active Problem List   Diagnosis Date Noted  . Breech presentation 02/05/2015  . Supervision of young multigravida, antepartum 11/17/2014  . Fetal anomaly 11/17/2014    Plan: Patient will undergo ECV.   Risks discussed, including failure, discomfort, placental abruption, fetal intolerance, fetal bradycardia, ROM, need for stat cesarean section.  Consent signed.  All questions answered.  Levie Heritage, DO  02/05/2015, 8:37 AM

## 2015-02-05 NOTE — Discharge Instructions (Signed)
C?n co th?t Braxton Hicks °(Braxton Hicks Contractions) °Co th?t t? cung có th? x?y ra trong su?t thai k?. Co th?t không ph?i lúc nào c?ng là d?u hi?u cho th?y quý v? chuy?n d?.  °C?N CO TH?T BRAXTON HICKS LÀ GÌ?  °C?n co th?t x?y ra tr??c khi sinh ???c g?i là c?n co th?t Braxton Hicks, hay chuy?n d? gi?. V? cu?i thai k? (32-24 tu?n), nh?ng c?n co th?t này x?y ra th??ng xuyên h?n và có th? m?nh h?n. C?n có th?t này không ph?i là chuy?n d? th?c s? vì chúng không làm m? (giãn n?) và làm m?ng c? t? cung. ?ôi khi có th? khó phân bi?t v?i chuy?n d? th?c s? vì nh?ng c?n co th?t này có th? m?nh và m?i ng??i l?i có s?c ch?u ?au khác nhau. Quý v? ??ng c?m th?y ng??ng ngùng n?u quý v? vào b?nh vi?n khi có chuy?n d? gi?. ?ôi khi, cách duy nh?t ?? bi?t quý v? có chuy?n d? th?t không là ph?i ?? chuyên gia ch?m sóc s?c kh?e phát hi?n nh?ng thay ??i ? c? t? cung. °N?u không có v?n ?? gì tr??c khi sinh ho?c nh?ng v?n ?? s?c kh?e khác liên quan ??n vi?c mang thai, quý v? có th? hoàn toàn an toàn tr? v? nhà khi chuy?n d? gi? và ??i cho ??n khi có c?n chuy?n d? th?t. °QUÝ V? CÓ TH? NH?N BI?T S? KHÁC NHAU GI?A CHUY?N D? TH?T VÀ CHUY?N D? GI? NH? TH? NÀO? °Chuy?n d? gi? °· C?n co th?t chuy?n d? gi? th??ng ng?n h?n và không khó ch?u nh? khi chuy?n d? th?t. °· Các c?n co th?t th??ng không ??u. °· Các c?n co th?t th??ng ???c c?m nh?n ? ph?n tr??c b?ng d??i và vùng b?n. °· Các c?n co th?t có th? h?t khi quý v? ?i l?i ho?c thay ??i t? th? khi n?m. °· Các c?n co th?t s? y?u h?n và kéo dài trong th?i gian ng?n h?n theo th?i gian. °· Các c?n co th?t th??ng không ti?n tri?n m?nh lên, ??u, và li?n nhau h?n nh? khi chuy?n d? th?t. °Chuy?n d? th?t °· C?n co th?t khi chuy?n d? th?t kéo dài 30-70 giây, tr? nên r?t ??u, th??ng là m?nh lên, và t?ng t?n su?t. °· C?n co th?t không h?t khi quý v? ?i l?i. °· C?m giác khó ch?u th??ng c?m th?y ? phía trên t? cung và lan t?i vùng b?ng d??i và vùng l?ng d??i. °· Chuyên gia ch?m sóc s?c kh?e có th? khám ?? xác  ??nh chuy?n d? là th?t. Khi khám s? th?y c? t? cung giãn n? và m?ng h?n. °C?N NH? ?I?U GÌ °· Ti?p t?c các bài th? d?c thông th??ng c?a quý v? và làm theo các ch? d?n khác c?a chuyên gia ch?m sóc s?c kh?e. °· S? d?ng thu?c theo ch? d?n c?a chuyên gia ch?m sóc s?c kh?e. °· Ti?p t?c l?ch h?n khám tr??c sinh nh? bình th??ng. °· ?n và u?ng ?? nh? n?u quý v? ngh? quý v? s?p chuy?n d?. °· N?u c?n co th?t Braxton Hicks làm quý v? khó ch?u: °¨ Thay ??i t? t? th? n?m ho?c ngh? ng?i sang ?i l?i, ho?c t? ?i l?i sang ngh? ng?i. °¨ Ng?i và ngh? ng?i trong m?t b?n n??c ?m. °¨ U?ng 2-3 c?c n??c. M?t n??c có th? gây ra nh?ng c?n co th?t nh? v?y. °¨ Th? ch?m và sâu vài l?n trong m?t ti?ng. °KHI NÀO TÔI C?N ?I   KHM NGAY L?P T?C? Hy ?i khm ngay l?p t?c n?u:  Cc c?n co th?t tr? nn m?nh h?n, ??u h?n, v g?n nhau h?n.  Qu v? b? r? d?ch ho?c ti?t d?ch t? m ??o.  Qu v? b? s?t.  Qu v? ra d?ch nh?y c l?n mu.  Qu v? b? ch?y mu m ??o.  Qu v? b? ?au b?ng lin t?c.  Qu v? b? ?au vng th?t l?ng m tr??c ?y ch?a t?ng b? bao gi?Ladell Heads.  Qu v? c?m th?y ??u c?a con qu v? ??y xu?ng d??i v gy p l?c ln vng khung ch?u.  Con qu v? khng c? ??ng nhi?u nh? tr??c.   Thng tin ny khng nh?m m?c ?ch thay th? cho l?i khuyn m chuyn gia ch?m [place] s?c kh?e ni v?i qu v?. Hy b?o ??m qu v? ph?i th?o lu?n b?t k? v?n ?? g m qu v? c v?i chuyn gia ch?m [place] s?c kh?e c?a qu v?.   Document Released: 03/07/2005 Document Revised: 03/12/2013 Elsevier Interactive Patient Education [employer] Inc.

## 2015-02-05 NOTE — Discharge Summary (Signed)
  Patient was here for outpatient procedure.  Discharge summary not needed.

## 2015-02-05 NOTE — Telephone Encounter (Signed)
Duplicate to get prescription to print.

## 2015-02-05 NOTE — Telephone Encounter (Signed)
Patient came by office to get prescription for prenatal vitamins, given.

## 2015-02-05 NOTE — Procedures (Signed)
After informed verbal consent, Terbutaline 0.25 mg SQ given, ECV was attempted under Ultrasound guidance.  One attempt was made.  The baby turned easily in a clockwise motion and confirmed vertex by US.   FHR was reactive before and after the procedure.   Pt. Tolerated the procedure well.  Levie HeritageJacob J Ellyse Rotolo, DO 02/05/2015, 9:31 AM

## 2015-02-07 ENCOUNTER — Encounter (HOSPITAL_COMMUNITY): Payer: Self-pay

## 2015-02-07 ENCOUNTER — Inpatient Hospital Stay (HOSPITAL_COMMUNITY): Payer: Medicaid Other | Admitting: Anesthesiology

## 2015-02-07 ENCOUNTER — Inpatient Hospital Stay (HOSPITAL_COMMUNITY)
Admission: AD | Admit: 2015-02-07 | Discharge: 2015-02-09 | DRG: 775 | Disposition: A | Discharge status: Home / Self Care | Payer: Medicaid Other | Source: Ambulatory Visit | Attending: Obstetrics & Gynecology | Admitting: Obstetrics & Gynecology

## 2015-02-07 DIAGNOSIS — O321XX0 Maternal care for breech presentation, not applicable or unspecified: Secondary | ICD-10-CM | POA: Diagnosis present

## 2015-02-07 DIAGNOSIS — O4292 Full-term premature rupture of membranes, unspecified as to length of time between rupture and onset of labor: Secondary | ICD-10-CM | POA: Diagnosis present

## 2015-02-07 DIAGNOSIS — Z3A39 39 weeks gestation of pregnancy: Secondary | ICD-10-CM

## 2015-02-07 DIAGNOSIS — IMO0002 Reserved for concepts with insufficient information to code with codable children: Secondary | ICD-10-CM

## 2015-02-07 DIAGNOSIS — Z818 Family history of other mental and behavioral disorders: Secondary | ICD-10-CM | POA: Diagnosis not present

## 2015-02-07 DIAGNOSIS — O09629 Supervision of young multigravida, unspecified trimester: Secondary | ICD-10-CM

## 2015-02-07 HISTORY — DX: Other specified health status: Z78.9

## 2015-02-07 LAB — CBC
HCT: 35.9 % — ABNORMAL LOW (ref 36.0–46.0)
HEMOGLOBIN: 11.5 g/dL — AB (ref 12.0–15.0)
MCH: 23 pg — AB (ref 26.0–34.0)
MCHC: 32 g/dL (ref 30.0–36.0)
MCV: 71.8 fL — ABNORMAL LOW (ref 78.0–100.0)
PLATELETS: 132 10*3/uL — AB (ref 150–400)
RBC: 5 MIL/uL (ref 3.87–5.11)
RDW: 14 % (ref 11.5–15.5)
WBC: 10.8 10*3/uL — AB (ref 4.0–10.5)

## 2015-02-07 LAB — TYPE AND SCREEN
ABO/RH(D): O POS
ANTIBODY SCREEN: NEGATIVE

## 2015-02-07 LAB — POCT FERN TEST: POCT FERN TEST: POSITIVE

## 2015-02-07 LAB — RPR: RPR: NONREACTIVE

## 2015-02-07 MED ORDER — SIMETHICONE 80 MG PO CHEW: 80.0000 mg | CHEWABLE_TABLET | ORAL | Status: DC | PRN

## 2015-02-07 MED ORDER — ONDANSETRON HCL 4 MG/2ML IJ SOLN: 4.0000 mg | INTRAMUSCULAR | Status: DC | PRN

## 2015-02-07 MED ORDER — FENTANYL CITRATE (PF) 100 MCG/2ML IJ SOLN
50.0000 ug | INTRAMUSCULAR | Status: DC | PRN
  Administered 2015-02-07 (×2): 50 ug via INTRAVENOUS
  Filled 2015-02-07 (×2): qty 2

## 2015-02-07 MED ORDER — ONDANSETRON HCL 4 MG PO TABS: 4.0000 mg | ORAL_TABLET | ORAL | Status: DC | PRN

## 2015-02-07 MED ORDER — TETANUS-DIPHTH-ACELL PERTUSSIS 5-2.5-18.5 LF-MCG/0.5 IM SUSP: 0.5000 mL | Freq: Once | INTRAMUSCULAR | Status: DC

## 2015-02-07 MED ORDER — PHENYLEPHRINE 40 MCG/ML (10ML) SYRINGE FOR IV PUSH (FOR BLOOD PRESSURE SUPPORT)
80.0000 ug | PREFILLED_SYRINGE | INTRAVENOUS | Status: DC | PRN
  Filled 2015-02-07: qty 2
  Filled 2015-02-07: qty 20

## 2015-02-07 MED ORDER — LACTATED RINGERS IV SOLN
INTRAVENOUS | Status: DC
Start: 2015-02-07 — End: 2015-02-07
  Administered 2015-02-07 (×3): via INTRAVENOUS

## 2015-02-07 MED ORDER — OXYTOCIN 40 UNITS IN LACTATED RINGERS INFUSION - SIMPLE MED: 1.0000 m[IU]/min | INTRAVENOUS | Status: DC

## 2015-02-07 MED ORDER — DIPHENHYDRAMINE HCL 50 MG/ML IJ SOLN
12.5000 mg | INTRAMUSCULAR | Status: DC | PRN
  Filled 2015-02-07: qty 1

## 2015-02-07 MED ORDER — LANOLIN HYDROUS EX OINT: TOPICAL_OINTMENT | CUTANEOUS | Status: DC | PRN

## 2015-02-07 MED ORDER — OXYCODONE-ACETAMINOPHEN 5-325 MG PO TABS: 2.0000 | ORAL_TABLET | ORAL | Status: DC | PRN

## 2015-02-07 MED ORDER — LIDOCAINE HCL (PF) 1 % IJ SOLN
INTRAMUSCULAR | Status: DC | PRN
  Administered 2015-02-07 (×2): 4 mL via EPIDURAL

## 2015-02-07 MED ORDER — METHYLERGONOVINE MALEATE 0.2 MG PO TABS
0.2000 mg | ORAL_TABLET | Freq: Four times a day (QID) | ORAL | Status: AC
Start: 2015-02-07 — End: 2015-02-08
  Administered 2015-02-07 – 2015-02-08 (×4): 0.2 mg via ORAL
  Filled 2015-02-07 (×4): qty 1

## 2015-02-07 MED ORDER — ACETAMINOPHEN 325 MG PO TABS: 650.0000 mg | ORAL_TABLET | ORAL | Status: DC | PRN

## 2015-02-07 MED ORDER — OXYTOCIN 40 UNITS IN LACTATED RINGERS INFUSION - SIMPLE MED
62.5000 mL/h | INTRAVENOUS | Status: DC
  Filled 2015-02-07: qty 1000

## 2015-02-07 MED ORDER — FENTANYL 2.5 MCG/ML BUPIVACAINE 1/10 % EPIDURAL INFUSION (WH - ANES)
14.0000 mL/h | INTRAMUSCULAR | Status: DC | PRN
  Administered 2015-02-07: 12 mL/h via EPIDURAL
  Filled 2015-02-07: qty 125

## 2015-02-07 MED ORDER — ZOLPIDEM TARTRATE 5 MG PO TABS: 5.0000 mg | ORAL_TABLET | Freq: Every evening | ORAL | Status: DC | PRN

## 2015-02-07 MED ORDER — WITCH HAZEL-GLYCERIN EX PADS
1.0000 | MEDICATED_PAD | CUTANEOUS | Status: DC | PRN
Start: 2015-02-07 — End: 2015-02-09

## 2015-02-07 MED ORDER — CITRIC ACID-SODIUM CITRATE 334-500 MG/5ML PO SOLN: 30.0000 mL | ORAL | Status: DC | PRN

## 2015-02-07 MED ORDER — ONDANSETRON HCL 4 MG/2ML IJ SOLN: 4.0000 mg | Freq: Four times a day (QID) | INTRAMUSCULAR | Status: DC | PRN

## 2015-02-07 MED ORDER — OXYCODONE-ACETAMINOPHEN 5-325 MG PO TABS: 1.0000 | ORAL_TABLET | ORAL | Status: DC | PRN

## 2015-02-07 MED ORDER — BENZOCAINE-MENTHOL 20-0.5 % EX AERO
1.0000 "application " | INHALATION_SPRAY | CUTANEOUS | Status: DC | PRN
  Administered 2015-02-07 – 2015-02-09 (×2): 1 via TOPICAL
  Filled 2015-02-07 (×2): qty 56

## 2015-02-07 MED ORDER — FLEET ENEMA 7-19 GM/118ML RE ENEM: 1.0000 | ENEMA | RECTAL | Status: DC | PRN

## 2015-02-07 MED ORDER — OXYTOCIN BOLUS FROM INFUSION
500.0000 mL | INTRAVENOUS | Status: DC
  Administered 2015-02-07: 500 mL via INTRAVENOUS

## 2015-02-07 MED ORDER — ACETAMINOPHEN 325 MG PO TABS
650.0000 mg | ORAL_TABLET | ORAL | Status: DC | PRN
  Administered 2015-02-08: 650 mg via ORAL
  Filled 2015-02-07: qty 2

## 2015-02-07 MED ORDER — PRENATAL MULTIVITAMIN CH
1.0000 | ORAL_TABLET | Freq: Every day | ORAL | Status: DC
  Administered 2015-02-08 – 2015-02-09 (×2): 1 via ORAL
  Filled 2015-02-07 (×2): qty 1

## 2015-02-07 MED ORDER — DIPHENHYDRAMINE HCL 25 MG PO CAPS: 25.0000 mg | ORAL_CAPSULE | Freq: Four times a day (QID) | ORAL | Status: DC | PRN

## 2015-02-07 MED ORDER — LIDOCAINE HCL (PF) 1 % IJ SOLN
30.0000 mL | INTRAMUSCULAR | Status: DC | PRN
  Filled 2015-02-07: qty 30

## 2015-02-07 MED ORDER — DIBUCAINE 1 % RE OINT
1.0000 | TOPICAL_OINTMENT | RECTAL | Status: DC | PRN
Start: 2015-02-07 — End: 2015-02-09

## 2015-02-07 MED ORDER — LACTATED RINGERS IV SOLN
500.0000 mL | INTRAVENOUS | Status: DC | PRN
  Administered 2015-02-07: 500 mL via INTRAVENOUS

## 2015-02-07 MED ORDER — IBUPROFEN 600 MG PO TABS
600.0000 mg | ORAL_TABLET | Freq: Four times a day (QID) | ORAL | Status: DC
  Administered 2015-02-07 – 2015-02-09 (×8): 600 mg via ORAL
  Filled 2015-02-07 (×8): qty 1

## 2015-02-07 MED ORDER — OXYTOCIN 40 UNITS IN LACTATED RINGERS INFUSION - SIMPLE MED: 62.5000 mL/h | INTRAVENOUS | Status: DC | PRN

## 2015-02-07 MED ORDER — EPHEDRINE 5 MG/ML INJ
10.0000 mg | INTRAVENOUS | Status: DC | PRN
  Filled 2015-02-07: qty 2

## 2015-02-07 MED ORDER — MEASLES, MUMPS & RUBELLA VAC ~~LOC~~ INJ
0.5000 mL | INJECTION | Freq: Once | SUBCUTANEOUS | Status: DC
  Filled 2015-02-07: qty 0.5

## 2015-02-07 MED ORDER — FENTANYL CITRATE (PF) 100 MCG/2ML IJ SOLN: 100.0000 ug | INTRAMUSCULAR | Status: DC | PRN

## 2015-02-07 MED ORDER — SENNOSIDES-DOCUSATE SODIUM 8.6-50 MG PO TABS
2.0000 | ORAL_TABLET | ORAL | Status: DC
  Administered 2015-02-07 – 2015-02-08 (×2): 2 via ORAL
  Filled 2015-02-07 (×2): qty 2

## 2015-02-07 NOTE — Anesthesia Procedure Notes (Signed)
Epidural Patient location during procedure: OB  Staffing Anesthesiologist: Ora Mcnatt Performed by: anesthesiologist   Preanesthetic Checklist Completed: patient identified, site marked, surgical consent, pre-op evaluation, timeout performed, IV checked, risks and benefits discussed and monitors and equipment checked  Epidural Patient position: sitting Prep: site prepped and draped and DuraPrep Patient monitoring: continuous pulse ox and blood pressure Approach: midline Location: L3-L4 Injection technique: LOR saline  Needle:  Needle type: Tuohy  Needle gauge: 17 G Needle length: 9 cm and 9 Needle insertion depth: 6 cm Catheter type: closed end flexible Catheter size: 19 Gauge Catheter at skin depth: 10 cm Test dose: negative  Assessment Events: blood not aspirated, injection not painful, no injection resistance, negative IV test and no paresthesia  Additional Notes Patient identified. Risks/Benefits/Options discussed with patient including but not limited to bleeding, infection, nerve damage, paralysis, failed block, incomplete pain control, headache, blood pressure changes, nausea, vomiting, reactions to medications, itching and postpartum back pain. Confirmed with bedside nurse the patient's most recent platelet count. Confirmed with patient that they are not currently taking any anticoagulation, have any bleeding history or any family history of bleeding disorders. Patient expressed understanding and wished to proceed. All questions were answered. Sterile technique was used throughout the entire procedure. Please see nursing notes for vital signs. Test dose was given through epidural catheter and negative prior to continuing to dose epidural or start infusion. Warning signs of high block given to the patient including shortness of breath, tingling/numbness in hands, complete motor block, or any concerning symptoms with instructions to call for help. Patient was given  instructions on fall risk and not to get out of bed. All questions and concerns addressed with instructions to call with any issues or inadequate analgesia.  Reason for block:procedure for pain

## 2015-02-07 NOTE — Lactation Note (Signed)
This note was copied from the chart of Karen Smith. Lactation Consultation Note  Patient Name: Karen Gelene MinkJuch Frieze ZOXWR'UToday's Date: 02/07/2015 Reason for consult: Initial assessment    With this mom of a term baby, now 324 hours old. Dad was formula feeding the baby when I walked in the room. Mom had breast fed twice since birth, and mom said she was "in pain" , and dad wanted to feed formula. Mom fed breast and formula with her first. I reviewed the effect of formula on milk supply, and the benefits of breast milk on keeping the baby's bili down. Mom reports first baby was under phototherapy. I enocouraged mom to keep baby skin to skin as much as possible, increase stooling, and increase feeds, and to feed with cues, 8-12 times a day. Cluster feeding also reviewed, as well as lactation services. Mom also advised to begin keeping a feeding diary.   Maternal Data Formula Feeding for Exclusion: Yes Reason for exclusion: Mother's choice to formula and breast feed on admission Has patient been taught Hand Expression?: Yes Does the patient have breastfeeding experience prior to this delivery?: Yes  Feeding Feeding Type: Breast Fed Length of feed: 10 min  LATCH Score/Interventions Latch: Grasps breast easily, tongue down, lips flanged, rhythmical sucking.  Audible Swallowing: None Intervention(s): Skin to skin  Type of Nipple: Everted at rest and after stimulation  Comfort (Breast/Nipple): Soft / non-tender     Hold (Positioning): Assistance needed to correctly position infant at breast and maintain latch. Intervention(s): Breastfeeding basics reviewed;Skin to skin  LATCH Score: 7  Lactation Tools Discussed/Used     Consult Status Consult Status: Follow-up Date: 02/08/15 Follow-up type: In-patient    Alfred LevinsLee, Mertice Uffelman Anne 02/07/2015, 6:16 PM

## 2015-02-07 NOTE — Progress Notes (Signed)
S: asked by rn to examine patient for slightly greater than average PP bleeding.   O:  Filed Vitals:   02/07/15 1530  BP: 137/70  Pulse: 101  Temp:   Resp: 16   Fundus firm, no clots expressed with palpation.  A/P: 21 G2P2002 now 2 hours PP. Normal blood loss at time of deliver. Had small amount of bleeding after finishing repair, manual exploration revealed small amount of trailing membrane which was manually removed.  - methergine 0.2 mg po q6 for 24 hours - close monitoring

## 2015-02-07 NOTE — Progress Notes (Signed)
[employer] called and no Interpreter available for the language of Karen Smith. Video Interpreter also not available for pt's language. Pt Speaks and Understands English per pt. Pt states she is comfortable receiving Epidural without an Interpreter and understands all information given to her. Anesthesia Mable Fill(B. Judd) and Charge RN Cleone Slim( H. Mitchell) also at bedside. Anesthesia feels somewhat comfortable placing epidural and is willing to proceed with procedure. Pt and Husband verbalize understanding and in agreement to get epidural.

## 2015-02-07 NOTE — MAU Note (Signed)
Contractions since last night about. Some mucousy d/c. Denies bleeding

## 2015-02-07 NOTE — H&P (Signed)
Karen Smith is a 24 y.o. female G2P1001 with IUP at [redacted]w[redacted]d presenting for Contractions, LOF.Marland Kitchen Pt states she has been having regular, every 3-4 minutes contractions, associated with spotting vaginal bleeding.  Membranes are ruptured, with active fetal movement.   [facility] since 22 wks  11/17, pt. With breech presentation. Successful version performed and vertex confirmed.  11/18 10:00 am pt. Noted gush of fluid and water broke. She is now approximately 20 hours from PROM. She denies fever, chills, nausea, or vomiting. Having active contractions as above.   Of note, Pt. Transferred to [facility]  Prenatal History/Complications:  Past Medical History: Past Medical History  Diagnosis Date  . Medical history non-contributory     Past Surgical History: Past Surgical History  Procedure Laterality Date  . No past surgeries      Obstetrical History: OB History    Gravida Para Term Preterm AB TAB SAB Ectopic Multiple Living   Obstetric Comments          Social History: Social History   Social History  . Marital Status: Single    Spouse Name: N/A  . Number of Children: N/A  . Years of Education: N/A   Social History Main Topics  . Smoking status: Never Smoker   . Smokeless tobacco: Never Used  . Alcohol Use: No  . Drug Use: No  . Sexual Activity: Yes    Birth Control/ Protection: None   Other Topics Concern  . None   Social History Narrative    Family History: Family History  Problem Relation Age of Onset  . Depression Brother     Allergies: Allergies  Allergen Reactions  . Terbutaline Itching and Rash    Red whelps, knots and itching at site of injection after discharged home.    Prescriptions prior to admission  Medication Sig Dispense Refill Last Dose  . Prenatal Vit-Fe Fumarate-FA (PRENATAL MULTIVITAMIN) TABS tablet Take 1 tablet by mouth daily at 12 noon. 30 tablet 2      Review of Systems   Constitutional: Negative.    Blood pressure 135/83, pulse 91, temperature 98 F (36.7 C), resp. rate 18, height  (1.448 m), weight 61.326 kg (135 lb 3.2 oz), last menstrual period 04/28/2014. General appearance: alert, cooperative and no distress Lungs: clear to auscultation bilaterally Heart: regular rate and rhythm Abdomen: soft, non-tender; bowel sounds normal Pelvic: 2-3cm, 40%, -2.  Extremities: Homans sign is negative, no sign of DVT Presentation: cephalic Fetal monitoringBaseline: 140 bpm Uterine activityDate/time of onset: 02/06/2015 10:00 am, Frequency: Every 4 minutes and Intensity: moderate Dilation: 2 Effacement (%): 80 Station: -2 Exam by:: Remigio Eisenmenger RN   Prenatal labs: ABO, Rh: --/--/O POS (11/19 0454) Antibody: PENDING (11/19 0445) Rubella: !Error! RPR: Non Reactive (11/17 0800)  HBsAg: Negative (04/25 0000)  HIV: NONREACTIVE (08/29 1034)  GBS: Negative (10/24 0000)  1 hr Glucola  106 3rd trimester Genetic screening  Too late Anatomy US WNL, not optimal cardiac views, HC <3rd% (parents are small), femur / humerus <5th%tile.    Prenatal Transfer Tool  Maternal Diabetes: No Genetic Screening: Declined Maternal Ultrasounds/Referrals: Normal Fetal Ultrasounds or other Referrals:  None Maternal Substance Abuse:  No Significant Maternal Medications:  None Significant Maternal Lab Results: None     Results for orders placed or performed during the hospital encounter of 02/07/15 (from the past 24 hour(s))  CBC   Collection Time: 02/07/15  4:45 AM  Result Value Ref Range   WBC 10.8 (H) 4.0 - 10.5 K/uL   RBC 5.00 3.87 - 5.11 MIL/uL   Hemoglobin 11.5 (L) 12.0 - 15.0 g/dL   HCT 16.135.9 (L) 09.636.0 - 04.546.0 %   MCV 71.8 (L) 78.0 - 100.0 fL   MCH 23.0 (L) 26.0 - 34.0 pg   MCHC 32.0 30.0 - 36.0 g/dL   RDW 40.914.0 81.111.5 - 91.415.5 %   Platelets 132 (L) 150 - 400 K/uL  [facility] and [facility]   Collection Time: 02/07/15  4:45 AM  Result Value Ref Range   ABO/RH(D) O POS     Antibody Screen PENDING    Sample Expiration 02/10/2015   Fern Test   Collection Time: 02/07/15  4:53 AM  Result Value Ref Range   POCT Fern Test Positive = ruptured amniotic membanes     Assessment: Karen Smith is a 24 y.o. G2P1001 at 2222w5d  here for active labor.  #Labor: expect SVD. Pitocin as needed.  #Pain: Fentanyl, Epidural.  #FWB: Cat I #ID:  GBS neg.  #MOF: Breast #MOC: Unsure #Circ:  Will discuss.   Caleb Melancon 02/07/2015, 5:39 AM   CNM attestation:  I have seen and examined this patient; I agree with above documentation in the resident's note.   Karen MinkJuch Gruver is a 24 y.o. [id number] here for PROM  PE: BP 113/76 mmHg  Pulse 77  Temp(Src) 98.5 F (36.9 C) (Oral)  Resp 18  Ht 4\' 9"  (1.448 m)  Wt 61.236 kg (135 lb)  BMI 29.21 kg/m2  SpO2 98%  LMP 04/28/2014  Breastfeeding? Unknown Gen: calm comfortable, NAD Resp: normal effort, no distress Abd: gravid  ROS, labs, PMH reviewed  [facility] Anticipate starting Pitocin to achieve labor prn Anticipate SVD  Cam HaiSHAW, Lania Zawistowski CNM 02/10/2015, 11:02 PM

## 2015-02-07 NOTE — MAU Note (Signed)
[facility]. Pt may come to 166

## 2015-02-07 NOTE — Anesthesia Preprocedure Evaluation (Signed)
Anesthesia Evaluation  Patient identified by MRN, date of birth, ID band Patient awake    Reviewed: Allergy & Precautions, H&P , NPO status , Patient's Chart, lab work & pertinent test results  Airway Mallampati: II  TM Distance: >3 FB Neck ROM: full    Dental no notable dental hx.    Pulmonary neg pulmonary ROS,    Pulmonary exam normal breath sounds clear to auscultation       Cardiovascular negative cardio ROS Normal cardiovascular exam Rhythm:regular Rate:Normal     Neuro/Psych negative neurological ROS  negative psych ROS   GI/Hepatic negative GI ROS, Neg liver ROS,   Endo/Other  negative endocrine ROS  Renal/GU negative Renal ROS  negative genitourinary   Musculoskeletal   Abdominal   Peds  Hematology negative hematology ROS (+)   Anesthesia Other Findings Primary language is other than english  Reproductive/Obstetrics (+) Pregnancy                             Anesthesia Physical Anesthesia Plan  ASA: II  Anesthesia Plan: Epidural   Post-op Pain Management:    Induction:   Airway Management Planned:   Additional Equipment:   Intra-op Plan:   Post-operative Plan:   Informed Consent: I have reviewed the patients History and Physical, chart, labs and discussed the procedure including the risks, benefits and alternatives for the proposed anesthesia with the patient or authorized representative who has indicated his/her understanding and acceptance.     Plan Discussed with:   Anesthesia Plan Comments:         Anesthesia Quick Evaluation

## 2015-02-08 LAB — CBC
HEMATOCRIT: 34.9 % — AB (ref 36.0–46.0)
HEMOGLOBIN: 10.9 g/dL — AB (ref 12.0–15.0)
MCH: 22.4 pg — ABNORMAL LOW (ref 26.0–34.0)
MCHC: 31.2 g/dL (ref 30.0–36.0)
MCV: 71.8 fL — AB (ref 78.0–100.0)
Platelets: 135 10*3/uL — ABNORMAL LOW (ref 150–400)
RBC: 4.86 MIL/uL (ref 3.87–5.11)
RDW: 14.1 % (ref 11.5–15.5)
WBC: 15.1 10*3/uL — ABNORMAL HIGH (ref 4.0–10.5)

## 2015-02-08 NOTE — Anesthesia Postprocedure Evaluation (Signed)
  Anesthesia Post-op Note  Patient: Karen Smith  Procedure(s) Performed: * No procedures listed *  Patient Location: Mother/Baby  Anesthesia Type:Epidural  Level of Consciousness: awake, alert , oriented and patient cooperative  Airway and Oxygen Therapy: Patient Spontanous Breathing  Post-op Pain: none  Post-op Assessment: Post-op Vital signs reviewed, Patient's Cardiovascular Status Stable, Respiratory Function Stable, Patent Airway, No headache, No backache and Patient able to bend at knees              Post-op Vital Signs: Reviewed and stable  Last Vitals:  Filed Vitals:   02/08/15 0430  BP: 108/53  Pulse: 79  Temp: 36.7 C  Resp: 20    Complications: No apparent anesthesia complications

## 2015-02-08 NOTE — Progress Notes (Signed)
Post Partum Day 1 Subjective: no complaints, up ad lib, voiding, tolerating PO, + flatus and some intermittent pain, but is controlled. Bleeding slowing down. Doing well this morning, up to the bathroom. Tolerating PO. Passing flatus.   Objective: Blood pressure 108/53, pulse 79, temperature 98.1 F (36.7 C), temperature source Oral, resp. rate 20, height 4\' 9"  (1.448 m), weight 61.236 kg (135 lb), last menstrual period 04/28/2014, SpO2 98 %, unknown if currently breastfeeding.  Physical Exam:  General: alert, cooperative and no distress Lochia: appropriate Uterine Fundus: firm DVT Evaluation: No evidence of DVT seen on physical exam.   Recent Labs  02/05/15 0800 02/07/15 0445  HGB 12.0 11.5*  HCT 37.7 35.9*    Assessment/Plan: Plan for discharge tomorrow, Breastfeeding, Lactation consult and Contraception Nexplanon  - PP bleeding requiring methergen.  - Check CBC this am.  - BP's mildly elevated PP. Within normal range now.  - Continue current care.  - D/C tomorrow.    LOS: 1 day   Karen Smith 02/08/2015, 7:21 AM

## 2015-02-08 NOTE — Clinical Social Work Maternal (Signed)
CLINICAL SOCIAL WORK MATERNAL/CHILD NOTE  Patient Details  Name: Karen Smith MRN: [id number] Date of Birth: 02/18/1991  Date: 02/08/2015  Clinical Social Worker Initiating Note: Herberta Pickron, LCSWDate/ Time Initiated: 02/08/15/1011   Child's Name: Ying Youse   Legal Guardian:  (Parents Thoaih Romah and Spring Masri)   Need for Interpreter: None   Date of Referral: 02/08/15   Reason for Referral: Other (Comment)   [facility]   Address: [address] [place], [place] [address]  Phone number:  ([phone])   Household Members: Self, Minor Children, Parents, Significant Other   Natural Supports (not living in the home): Extended Family   Professional Supports:None   Employment: (FOB is employed)   Type of Work:     Education:     Financial Resources:Medicaid   Other Resources: WIC, Food Stamps    Cultural/Religious Considerations Which May Impact Care: none reported  Strengths: Ability to meet basic needs , Home prepared for child    Risk Factors/Current Problems: None   Cognitive State: Alert , Able to Concentrate    Mood/Affect: Happy    CSW Assessment: Acknowledged order for social work consult regarding financial concerns. Met with both parents. They were pleasant and receptive to social work. Parents are not married. They reside with maternal grandmother and has one other dependent age 2. Mother reports no financial concerns at this time. Informed that FOB is employed and the home is prepared for newborn. She denies any hx of substance abuse or mental illness. No acute social concerns noted or reported at this time. Parents informed of social work availability.  CSW Plan/Description:    No further intervention required No barriers to discharge   Hadassa Cermak J, LCSW 02/08/2015, 10:29 AM   CLINICAL SOCIAL WORK MATERNAL/CHILD NOTE  Patient Details  Name: Karen Smith MRN: [id number] Date of Birth: 10/25/1990  Date:  02/08/2015  Clinical Social Worker Initiating Note:  Norlene Duel, LCSW Date/ Time Initiated:  02/08/15/1011     Child's Name:  Edrick Oh   Legal Guardian:   (Parents Thoaih Romah and Hollice Espy)   Need for Interpreter:  None   Date of Referral:  02/08/15     Reason for Referral:  Other (Comment)   Referral Source:  [facility]   Address:  [address]  [place], [place] [address]  Phone number:   [phone])   Household Members:  Self, Minor Children, Parents, Significant Other   Natural Supports (not living in the home):  Extended Family   Professional Supports: None   Employment:  (FOB is employed)   Type of Work:     Education:      Pensions consultant:  [employer]   Other Resources:  [employer], Physicist, medical    Cultural/Religious Considerations Which May Impact Care:  none reported  Strengths:  Ability to meet basic needs , Home prepared for child    Risk Factors/Current Problems:  None   Cognitive State:  Alert , Able to Concentrate    Mood/Affect:  Happy    CSW Assessment:  Acknowledged order for social work consult regarding financial concerns.  Met with both parents.  They were pleasant and receptive to social work.   Parents are not married.  They reside with maternal grandmother and has one other dependent age 14.  Mother reports no financial concerns at this time.  Informed that FOB is employed and the home is prepared for newborn.  She denies any hx of substance abuse or mental illness.     No acute social concerns noted or reported at this time.   Parents informed of social work Fish farm manager.  CSW Plan/Description:     No further intervention required No barriers to discharge   Patriciaann Rabanal J, LCSW 02/08/2015, 10:29 AM

## 2015-02-08 NOTE — Progress Notes (Deleted)
CLINICAL SOCIAL WORK MATERNAL/CHILD NOTE  Patient Details  Name: Karen Smith MRN: [id number] Date of Birth: 07-23-1990  Date: 02/08/2015  Clinical Social Worker Initiating Note: Kazimir Hartnett, LCSWDate/ Time Initiated: 02/08/15/1011   Child's Name: Karen Smith   Legal Guardian:  (Parents Thoaih Romah and Lucas Hamman)   Need for Interpreter: None   Date of Referral: 02/08/15   Reason for Referral: Other (Comment)   Referral Source: [facility]   Address: [address] [place], [place] [address]  Phone number:  [phone])   Household Members: Self, Minor Children, Parents, Significant Other   Natural Supports (not living in the home): Extended Family   Professional Supports:None   Employment: (FOB is employed)   Type of Work:     Education:     Museum/gallery curator Resources:Medicaid   Other Resources: [employer], Physicist, medical    Cultural/Religious Considerations Which May Impact Care: none reported  Strengths: Ability to meet basic needs , Home prepared for child    Risk Factors/Current Problems: None   Cognitive State: Alert , Able to Concentrate    Mood/Affect: Happy    CSW Assessment: Acknowledged order for social work consult regarding financial concerns. Met with both parents. They were pleasant and receptive to social work. Parents are not married. They reside with maternal grandmother and has one other dependent age 2. Mother reports no financial concerns at this time. Informed that FOB is employed and the home is prepared for newborn. She denies any hx of substance abuse or mental illness. No acute social concerns noted or reported at this time. Parents informed of social work Fish farm manager.  CSW Plan/Description:    No further intervention required No barriers to discharge   Darlean Warmoth J, LCSW 02/08/2015, 10:29 AM

## 2015-02-09 ENCOUNTER — Ambulatory Visit: Payer: Self-pay

## 2015-02-09 MED ORDER — IBUPROFEN 600 MG PO TABS: 600.0000 mg | ORAL_TABLET | Freq: Four times a day (QID) | ORAL | Status: AC

## 2015-02-09 NOTE — Lactation Note (Signed)
This note was copied from the chart of Karen Smith. Lactation Consultation Note  Patient Name: Karen Gelene MinkJuch Mayeda EAVWU'JToday's Date: 02/09/2015 Reason for consult: Follow-up assessment Mom states bf is going well. No breast/nipple pain or concerns at this time. Encouraged mom to offer the breast at every feeding cue, both sides for at least 10 minutes or more, then f/u with formula as needed. Discussed the benefits for offering more breast milk. Mom seemed in agreeance with this feeding plan. She is aware of OP services and support group should she have questions when she is DC.   Maternal Data    Feeding Feeding Type: Formula  LATCH Score/Interventions                      Lactation Tools Discussed/Used     Consult Status Consult Status: Follow-up Date: 02/10/15 Follow-up type: In-patient    Karen Smith 02/09/2015, 8:19 PM

## 2015-02-09 NOTE — Progress Notes (Signed)
Mother and father declined translation of discharge teaching using language line, went over discharge instructions with mother and father in [place], mother and father verbally understood, I asked if either parent had any questions, questions were answered and both mother and father verbally stated they understood discharge instructions and that language line was not needed.

## 2015-02-09 NOTE — Discharge Summary (Signed)
OB Discharge Summary     Patient Name: Karen Smith DOB: 1990-12-23 MRN: [id number]  Date of admission: 02/07/2015 Delivering MD: Shonna Chock BEDFORD   Date of discharge: 02/09/2015  Admitting diagnosis: 39wks5d, CTX, [place] Intrauterine pregnancy: [redacted]w[redacted]d     Secondary diagnosis:  Principal Problem:   NSVD (normal spontaneous vaginal delivery) Active Problems:   Supervision of young multigravida, antepartum   Fetal anomaly   Breech presentation   Normal labor   Second degree perineal laceration  Additional problems:  Initially with breech presentation s/p successful version on 11/17.      Discharge diagnosis: Term Pregnancy Delivered                                                                                                Post partum procedures:none  Augmentation: Pitocin  Complications: None  Hospital course:  Onset of Labor With Vaginal Delivery     24 y.o. yo [id number] at [redacted]w[redacted]d was admitted in Active Laboron 02/07/2015. Patient had an uncomplicated labor course as follows:  Membrane Rupture Time/Date: 10:00 AM ,02/07/2015   Intrapartum Procedures: Episiotomy: None [1]                                         Lacerations:  2nd degree [3]  Patient had a delivery of a Viable infant. 02/07/2015  Information for the patient's newborn:  Ayana, Imhof Girl Randall [[id number]]  Delivery Method: Vaginal, Spontaneous Delivery (Filed from Delivery Summary)   The patient had manual extraction of trailing membranes due to increased bleeding postpartum. She received methergine PO for 24 hours. Pateint had an uncomplicated postpartum course.  She is ambulating, tolerating a regular diet, passing flatus, and urinating well. Patient is discharged home in stable condition on 02/09/2015     Physical exam  Filed Vitals:   02/07/15 2218 02/08/15 0430 02/08/15 1830 02/09/15 0610  BP: 123/65 108/53 109/62 113/76  Pulse: 101 79 75 77  Temp: 98.6 F (37 C) 98.1 F (36.7 C) 98.3 F (36.8  C) 98.5 F (36.9 C)  TempSrc:   Oral Oral  Resp: Height:      Weight:      SpO2:       General: alert, cooperative and no distress Lochia: appropriate Uterine Fundus: firm GYN: Labia with mild swelling, no bleeding or hematoma on vaginal exam.  Incision: N/A DVT Evaluation: No evidence of DVT seen on physical exam. Labs: Lab Results  Component Value Date   WBC 15.1* 02/08/2015   HGB 10.9* 02/08/2015   HCT 34.9* 02/08/2015   MCV 71.8* 02/08/2015   PLT 135* 02/08/2015   No flowsheet data found.  Discharge instruction: per After Visit Summary and "Baby and Me Booklet".  After visit meds:    Medication List    TAKE these medications        ibuprofen 600 MG tablet  Commonly known as:  ADVIL,MOTRIN  Take 1 tablet (600 mg total) by mouth every 6 (six) hours.  prenatal multivitamin Tabs tablet  Take 1 tablet by mouth daily at 12 noon.        Diet: routine diet  Activity: Advance as tolerated. Pelvic rest for 6 weeks.   Outpatient follow up:6 weeks Follow up Appt: [facility] Postpartum contraception: Nexplanon  Newborn Data: Live born female  Birth Weight: 7 lb 0.2 oz (3181 g) APGAR: 8, 9  Baby Feeding: Bottle and Breast Disposition:home with mother   02/09/2015 Devota Pacealeb Melancon, MD  OB fellow attestation I have seen and examined this patient and agree with above documentation in the resident's note.   Gelene MinkJuch Rundell is a 24 y.o. [id number] s/p NSVD after successful ECV.   Pain is well controlled.  Plan for birth control is Nexplanon.  Method of Feeding: Bottle  PE:  BP 113/76 mmHg  Pulse 77  Temp(Src) 98.5 F (36.9 C) (Oral)  Resp 18  Ht 4\' 9"  (1.448 m)  Wt 135 lb (61.236 kg)  BMI 29.21 kg/m2  SpO2 98%  LMP 04/28/2014  Breastfeeding? Unknown Gen: well appearing Heart: reg rate Lungs: normal WOB Fundus firm Ext: soft, no pain, no edema   Recent Labs  02/07/15 0445 02/08/15 0706  HGB 11.5* 10.9*  HCT 35.9*  34.9*    Plan: discharge today - postpartum care discussed - f/u clinic in 6 weeks for postpartum visit   Federico FlakeKimberly Niles Newton, MD 9:41 AM

## 2015-02-09 NOTE — Lactation Note (Signed)
This note was copied from the chart of Karen Tyquisha Drennen. Lactation Consultation Note Experienced BF mom has good everted nipples, mom states baby doesn't open mouth wide at times, encouraged to do chin tug. Mom has good easy colostrum flow. Baby on DPT now. FOB supplementing w/to much formula and baby had been crying earlier. Baby sleeping w/mom in side lying position. Put in bed back under DPT. RN stressed to FOB baby needs mom breast milk and not so much formula, baby was fussy earlier. Patient Name: Karen Smith ZOXWR'UToday's Date: 02/09/2015 Reason for consult: Follow-up assessment   Maternal Data    Feeding Feeding Type: Breast Fed Nipple Type: Slow - flow Length of feed: 15 min  LATCH Score/Interventions       Type of Nipple: Everted at rest and after stimulation  Comfort (Breast/Nipple): Soft / non-tender     Hold (Positioning): Assistance needed to correctly position infant at breast and maintain latch.     Lactation Tools Discussed/Used     Consult Status Consult Status: Follow-up Date: 02/09/15 (pm) Follow-up type: In-patient    Lucresha Dismuke, Diamond NickelLAURA G 02/09/2015, 4:05 AM

## 2015-02-09 NOTE — Discharge Instructions (Signed)

## 2015-02-10 ENCOUNTER — Ambulatory Visit: Payer: Self-pay

## 2015-02-10 ENCOUNTER — Other Ambulatory Visit: Payer: Medicaid Other

## 2015-02-10 NOTE — Lactation Note (Signed)
This note was copied from the chart of Karen Smith. Lactation Consultation Note  Baby latched in cradle hold upon entering on the tip of mother's L nipple. Mother states her R nipple is hurting.  No trauma noted.  Provided mother w/ comfort gels and discussed applying ebm. Helped baby latch deeper on breast and provided education. Reviewed engorgement care and monitoring voids/stools. Mom encouraged to feed baby 8-12 times/24 hours and with feeding cues. Encouraged bf before offering formula.  Patient Name: Karen Smith ZOXWR'UToGelene Minkday's Date: 02/10/2015 Reason for consult: Follow-up assessment   Maternal Data    Feeding Feeding Type: Breast Fed  LATCH Score/Interventions Latch: Grasps breast easily, tongue down, lips flanged, rhythmical sucking. (observed relatch)  Audible Swallowing: A few with stimulation  Type of Nipple: Everted at rest and after stimulation  Comfort (Breast/Nipple): Filling, red/small blisters or bruises, mild/mod discomfort  Problem noted: Mild/Moderate discomfort  Hold (Positioning): No assistance needed to correctly position infant at breast.  LATCH Score: 8  Lactation Tools Discussed/Used     Consult Status Consult Status: Complete    Hardie PulleyBerkelhammer, Nuria Phebus Boschen 02/10/2015, 10:29 AM

## 2015-03-12 ENCOUNTER — Ambulatory Visit: Payer: Medicaid Other | Admitting: Obstetrics & Gynecology

## 2015-03-12 ENCOUNTER — Ambulatory Visit (INDEPENDENT_AMBULATORY_CARE_PROVIDER_SITE_OTHER): Payer: Medicaid Other | Admitting: Certified Nurse Midwife

## 2015-03-12 LAB — POCT PREGNANCY, URINE: Preg Test, Ur: NEGATIVE

## 2015-03-12 NOTE — Progress Notes (Signed)
Patient ID: Karen Smith, female   DOB: December 08, 1990, 24 y.o.   MRN: [id number] Subjective:     Karen Smith is a 24 y.o. female who presents for a postpartum visit. She is 4 week postpartum following a spontaneous vaginal delivery. I have fully reviewed the prenatal and intrapartum course. The delivery was at 39  gestational weeks. Outcome: spontaneous vaginal delivery. Anesthesia: epidural. Postpartum course has been uneventful. Baby's course has been uneventful. Baby is feeding by breast. Bleeding no bleeding. Bowel function is normal. Bladder function is normal. Patient is not sexually active. Contraception method is none. Postpartum depression screening: negative.  The following portions of the patient's history were reviewed and updated as appropriate: past family history, past social history, past surgical history and problem list.  Review of Systems Pertinent items are noted in HPI.   Objective:    BP 89/46 mmHg  Pulse 70  Temp(Src) 98.3 F (36.8 C)  Wt 120 lb 9.6 oz (54.704 kg)  General:  alert and cooperative   Breasts:  inspection negative, no nipple discharge or bleeding, no masses or nodularity palpable  Lungs: clear to auscultation bilaterally  Heart:  regular rate and rhythm, S1, S2 normal, no murmur, click, rub or gallop  Abdomen: soft, non-tender; bowel sounds normal; no masses,  no organomegaly   Vulva:  not evaluated  Vagina: not evaluated  Cervix:    Corpus: not examined  Adnexa:  normal adnexa  Rectal Exam: Not performed.        Assessment:     4 postpartum exam.   Plan:    1. Contraception: none 2.  3. Follow up in: 1 year or as needed.

## 2015-05-12 ENCOUNTER — Encounter: Payer: Self-pay | Admitting: *Deleted

## 2017-08-24 IMAGING — US US MFM OB DETAIL+14 WK
1 series · 13 of 28 positions shown · non-contrast
Comparison: none

[Series 1: us mfm ob detail+14 wk · 13 of 70 slices shown]
[im 3/70]
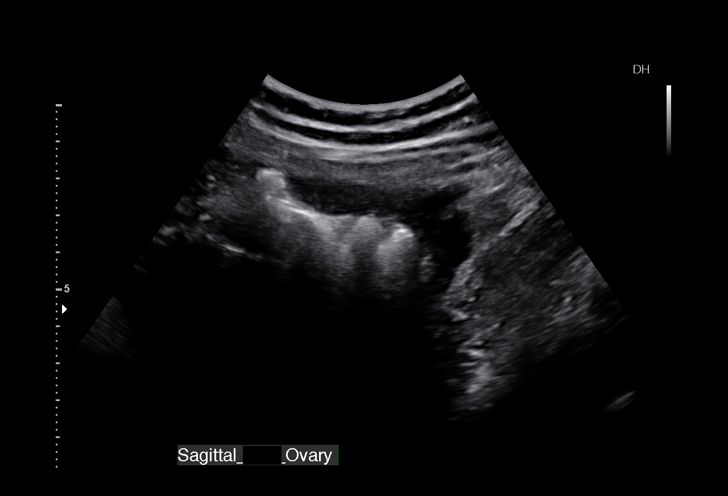
[im 8/70]
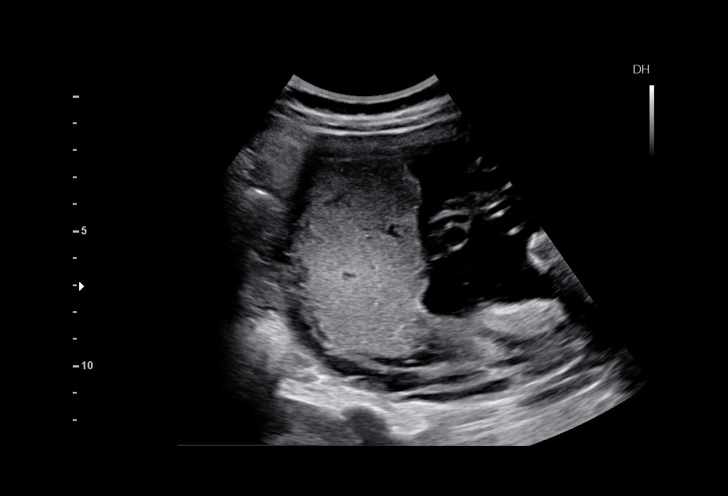
[im 13/70]
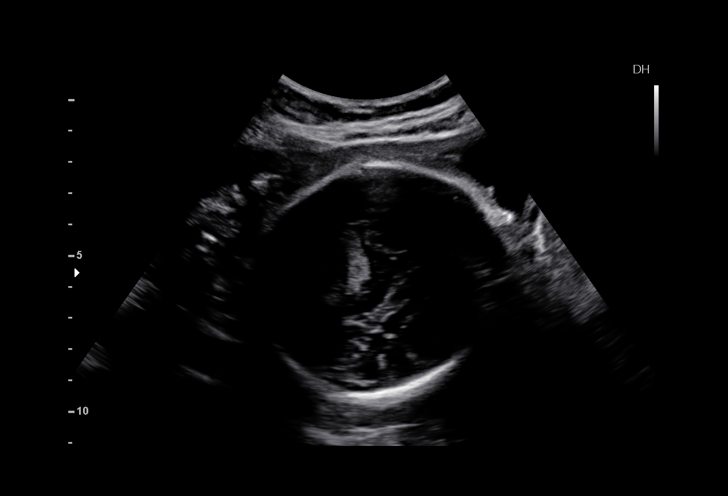
[im 18/70]
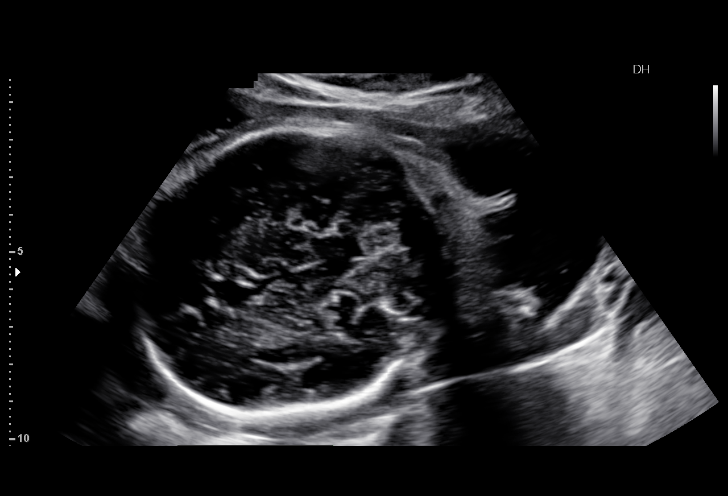
[im 24/70]
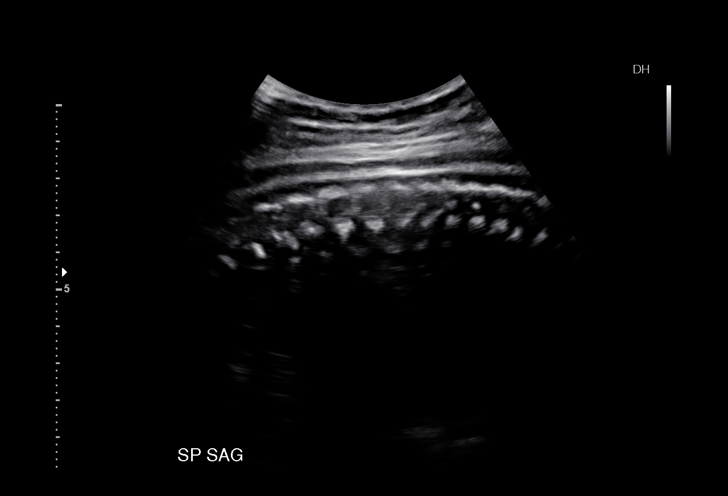
[im 29/70]
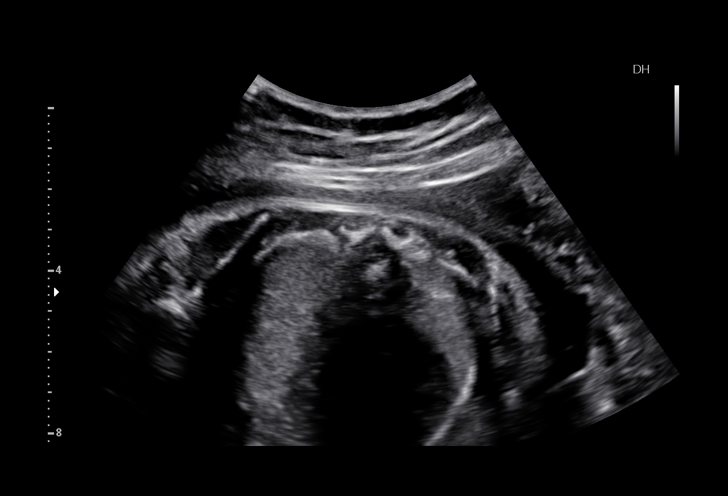
[im 36/70]
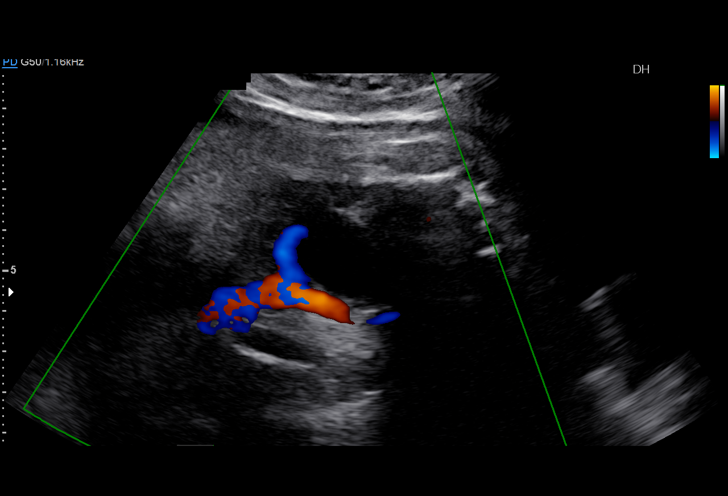
[im 41/70]
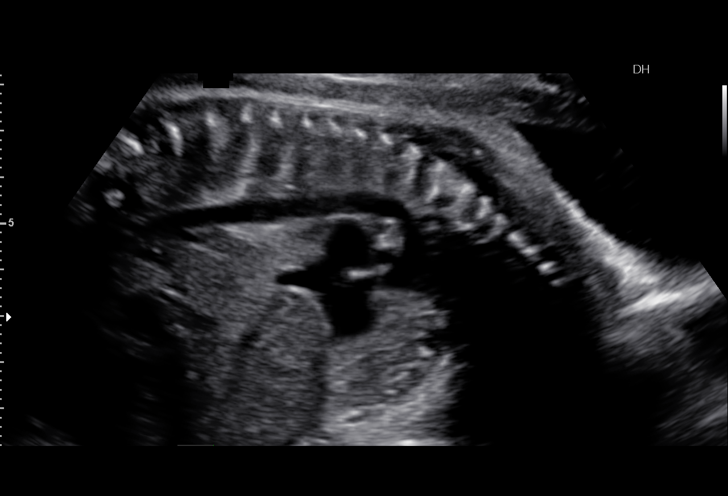
[im 47/70]
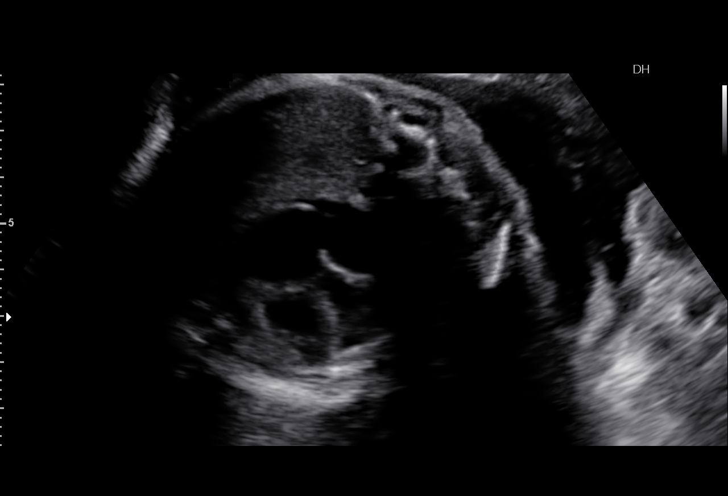
[im 52/70]
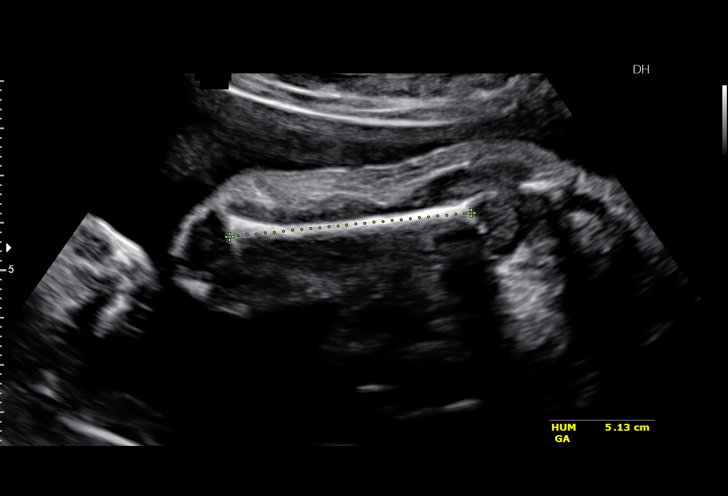
[im 57/70]
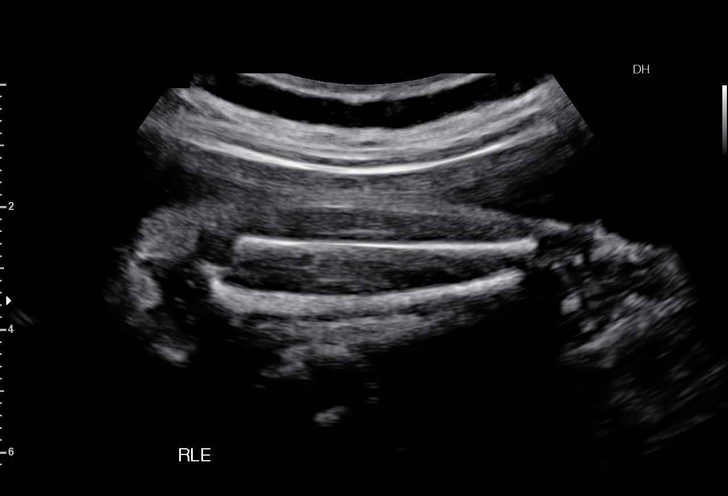
[im 62/70]
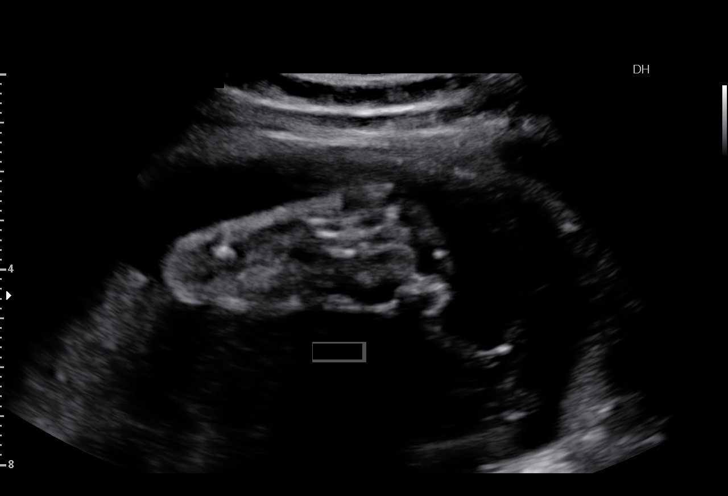
[im 67/70]
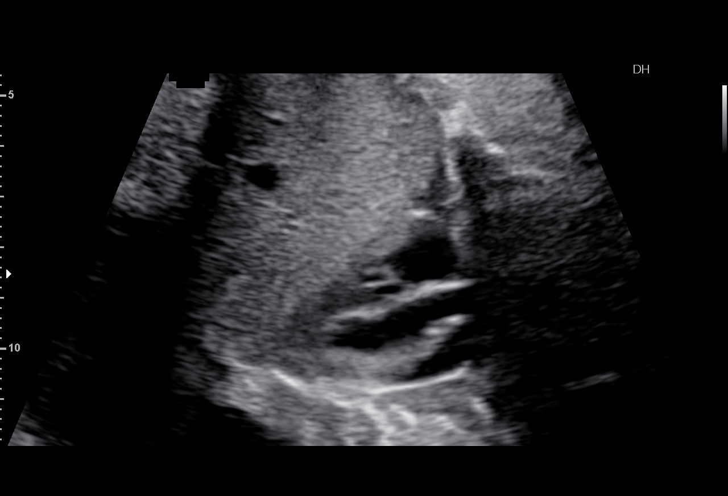

[13 of 28 positions shown; findings below may reference images not displayed]

OBSTETRICS REPORT
(Signed Final 12/15/2014 [DATE])

Name:       KEZDET MENCSIK                            Visit  12/15/2014 [DATE]
Date:

Faculty Physician
Service(s) Provided

Indications

33 weeks gestation of pregnancy
Detailed fetal anatomic survey                         Z36
Microcephaly
Abnormal finding on antenatal screening (Low
uE3 0.34 MoM)
Fetal Evaluation

Num Of             1
Fetuses:
Fetal Heart        149                          bpm
Rate:
Cardiac Activity:  Observed
Presentation:      Cephalic
Placenta:          Posterior Fundal, above
cervical os
P. Cord            Visualized, central
Insertion:

Amniotic Fluid
AFI FV:      Subjectively within normal limits
AFI Sum:     16.33    cm      59  %Tile     Larg Pckt:      5.1  cm
RUQ:   4.21    cm    RLQ:   4.32    cm   LUQ:    2.7     cm   LLQ:    5.1    cm
Biometry

BPD:     75.7   m    G. Age:   30w 3d                 CI:        74.82   70 - 86
m
FL/HC:      19.7   19.9 -
21.5
HC:     277.7   m    G. Age:   30w 3d       < 3  %    HC/AC:      0.99   0.96 -
m
AC:     279.9   m    G. Age:   32w 0d        24  %    FL/BPD      72.4   71 - 87
m                                     :
FL:      54.8   m    G. Age:   28w 6d       < 3  %    FL/AC:      19.6   20 - 24
m
HUM:     51.7   m    G. Age:   30w 1d       < 5  %
m
CER:     39.2   m    G. Age:   33w 1d        52  %
m
ULN:     49.8   m    G. Age:   31w 4d        14  %
m
TIB:     51.2   m    G. Age:   30w 4d         7  %
m
RAD:     43.9   m    G. Age:   30w 6d        31  %
m
FIB:     49.2   m    G. Age:   30w 1d        40  %
m

Est.        7696   gm   3 lb 10 oz      20   %
FW:
Gestational Age

LMP:           33w 0d        Date:  04/28/14                  EDD:   02/02/15
U/S Today:     30w 3d                                         EDD:   02/20/15
Best:          33w 0d    Det. By:   LMP  (04/28/14)           EDD:   02/02/15
Anatomy

Cranium:          Appears normal         Aortic Arch:       Appears normal
Fetal Cavum:      Appears normal         Ductal Arch:       Appears normal
Ventricles:       Appears normal         Diaphragm:         Appears normal
Choroid Plexus:   Appears normal         Stomach:           Appears normal,
left sided
Cerebellum:       Appears normal         Abdomen:           Appears normal
Posterior         Appears normal         Abdominal          Appears nml (cord
Fossa:                                   Wall:              insert, abd wall)
Nuchal Fold:      Not applicable (>20    Cord Vessels:      Appears normal (3
wks GA)                                   vessel cord)
Face:             Appears normal         Kidneys:           Appear normal
(orbits and profile)
Lips:             Appears normal         Bladder:           Appears normal
Heart:            Not well visualized    Spine:             Appears normal
RVOT:             Appears normal         Lower              Appears normal
Extremities:
LVOT:             Not well visualized    Upper              Appears normal
Extremities:

Other:   Fetus appears to be a female. Heels visualized. Technicallly
difficult due to advanced GA and fetal position.
Targeted Anatomy

Fetal Central Nervous System
Cisterna
Magna:
Cervix Uterus Adnexa

Cervix:       Not visualized (advanced GA >13wks)
Left Ovary:    Within normal limits.
Right Ovary:   Within normal limits.

Adnexa:     No abnormality visualized.
Impression

Single IUP at 33w 0d
The HC measures < 3rd %tile (roughly at 2 SD below the
mean for gestatiaonal age)
The fetal cranial anatomy appears normal
The femurs and humeri measure < 5th %tile, but appear
normal in morphology - likely constitutional
Somewhat limited views of the fetal heart were obtained
due to late gestational age and position
The estimated fetal weight is at the 20th %tile.
Normal amniotic fluid volume
Recommendations

Recommend follow-up ultrasound examination in 3 weeks
for interval growth and to reevaluate the fetal heart anatomy

Thank you for sharing in the care of Ms. KEZDET MENCSIK with
questions or concerns.

## 2017-09-14 IMAGING — US US MFM OB FOLLOW-UP
1 series · 14 of 28 positions shown · non-contrast
Comparison: none

[Series 1: us mfm ob follow-up · 56 acquisitions, 14 frames shown]
[im 3/56]
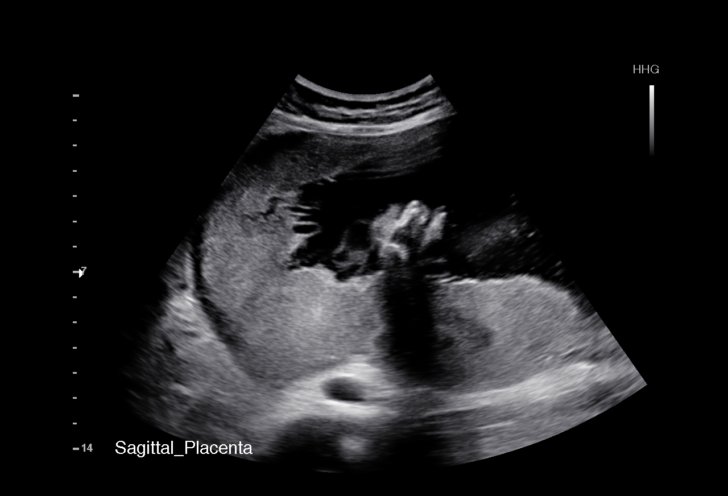
[im 7/56]
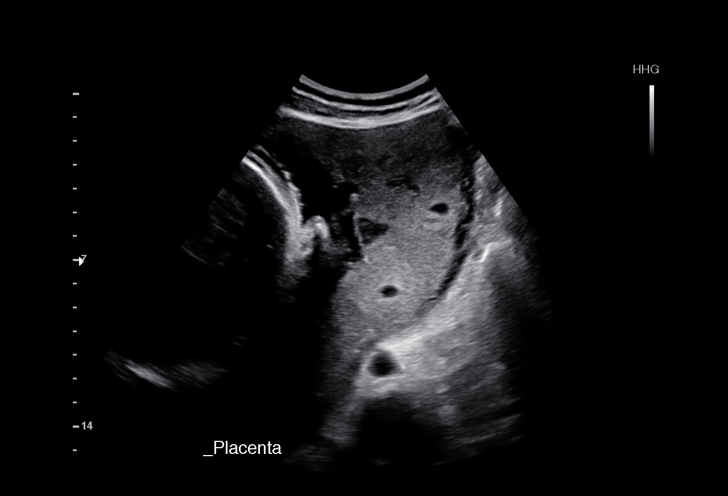
[im 11/56]
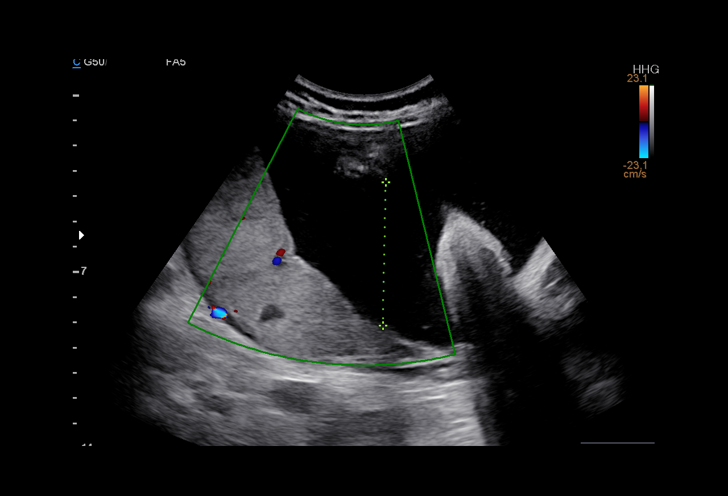
[im 15/56]
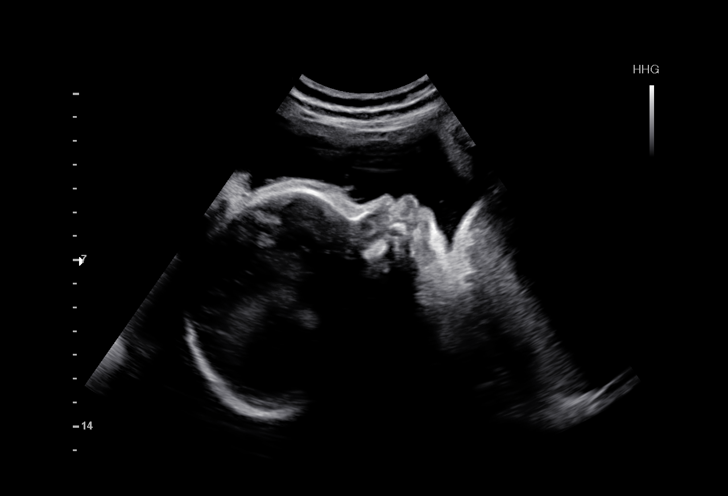
[im 19/56]
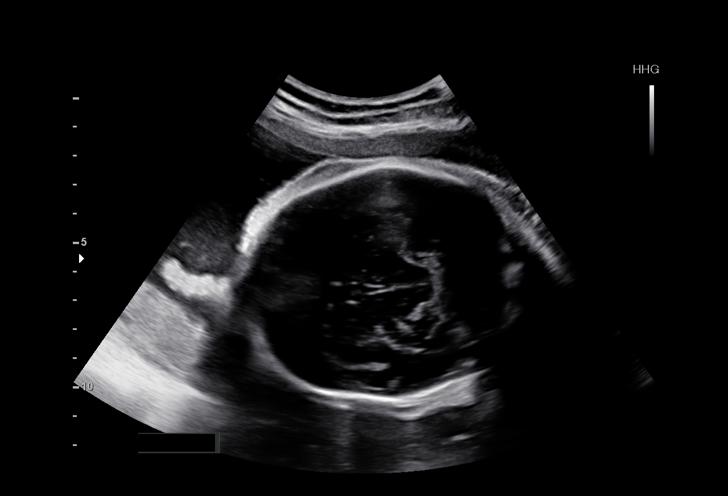
[im 23/56]
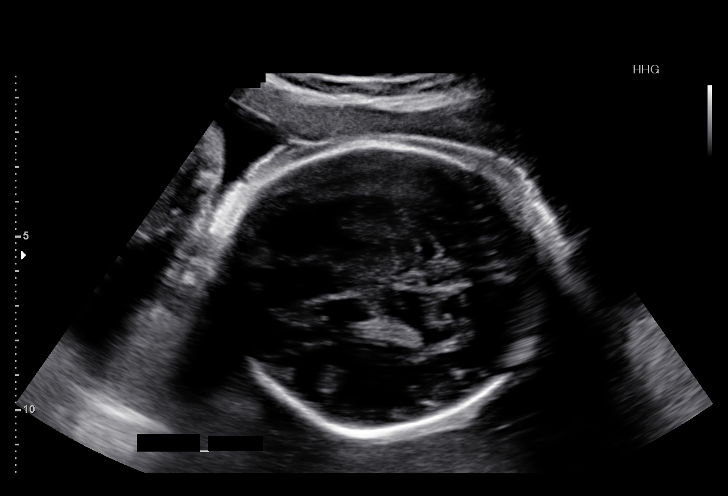
[im 27/56]
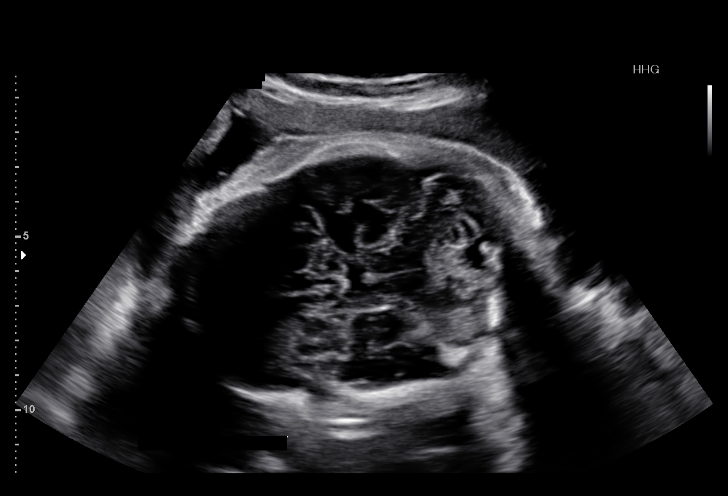
[im 31/56]
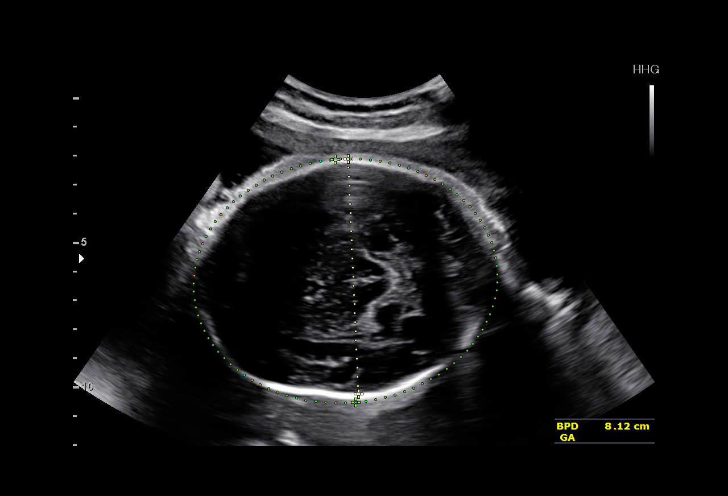
[im 35/56]
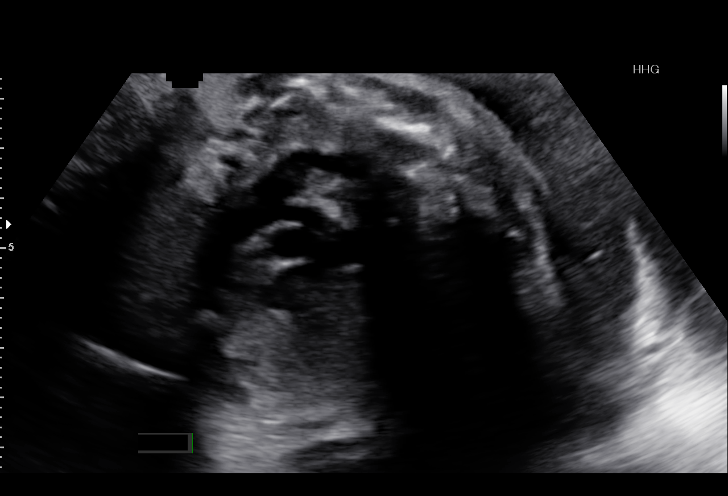
[im 39/56]
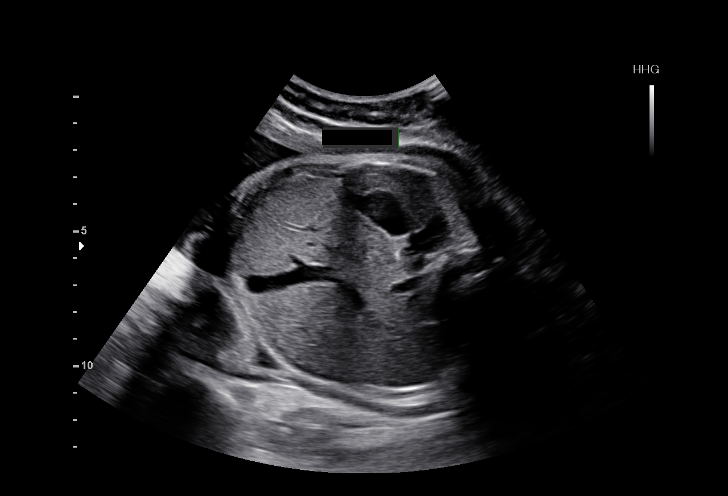
[im 43/56]
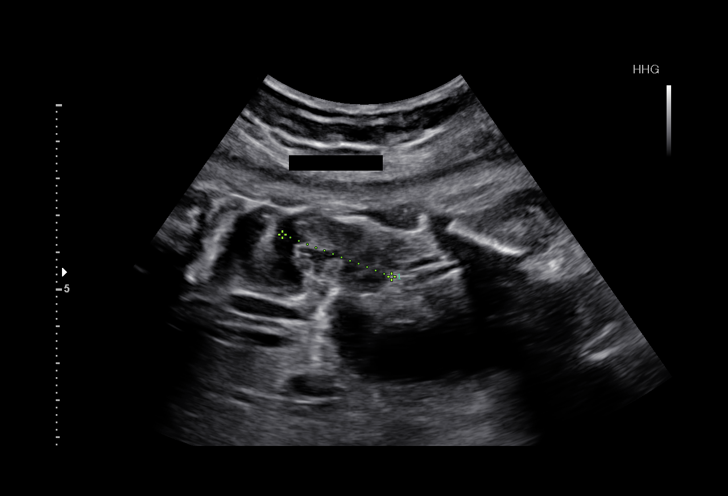
[im 47/56]
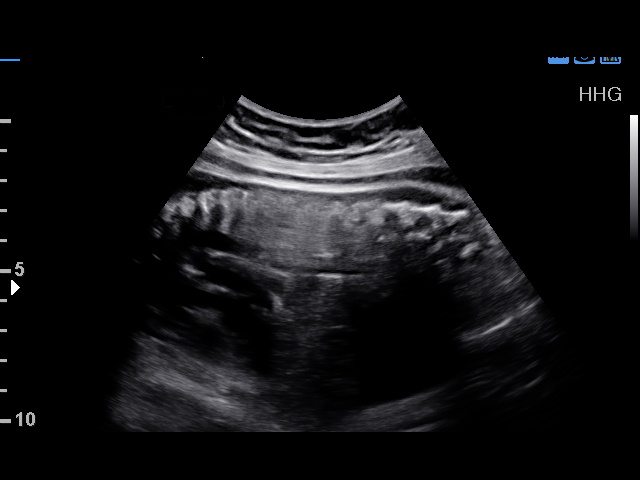
[im 51/56]
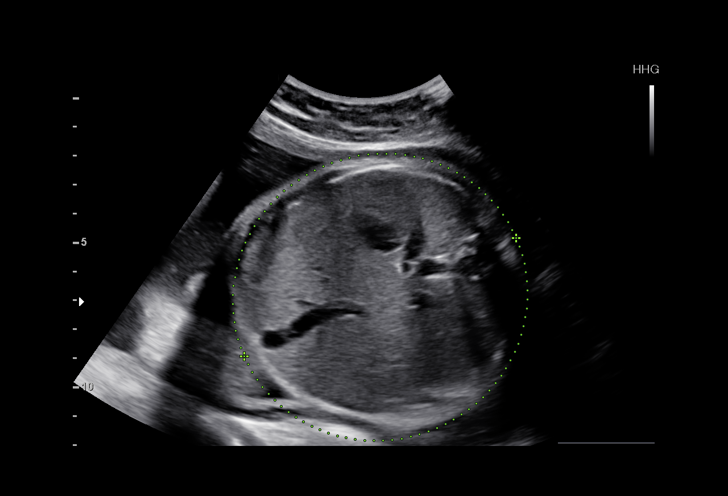
[im 56/56]
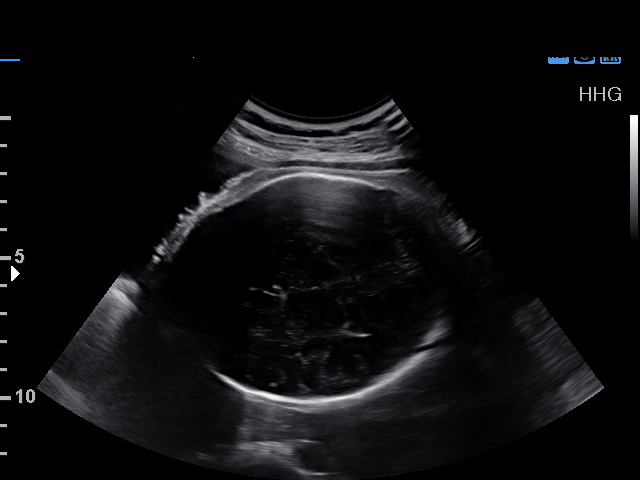

[14 of 28 positions shown; findings below may reference images not displayed]

OBSTETRICS REPORT
(Signed Final 01/05/2015 [DATE])

Service(s) Provided

Indications

Follow-up incomplete fetal anatomic evaluation        Z36
35 weeks gestation of pregnancy
Microcephaly
Abnormal finding on antenatal screening (Low uE3
0.34 MoM)
Fetal Evaluation

Num Of Fetuses:    1
Fetal Heart Rate:  161                          bpm
Cardiac Activity:  Observed
Presentation:      Breech
Placenta:          Right lateral, above
cervical os
P. Cord            Visualized
Insertion:

Amniotic Fluid
AFI FV:      Subjectively within normal limits
AFI Sum:     18.92   cm       70  %Tile     Larg Pckt:    5.67  cm
RUQ:   5.67    cm   RLQ:    4.7    cm    LUQ:   3.97    cm   LLQ:    4.58   cm
Biometry

BPD:       81  mm     G. Age:  32w 4d                CI:        72.28   70 - 86
FL/HC:      20.6   20.1 -
22.3
HC:     303.1  mm     G. Age:  33w 5d        3  %    HC/AC:      0.96   0.93 -
1.11
AC:     314.1  mm     G. Age:  35w 2d       66  %    FL/BPD:     77.0   71 - 87
FL:      62.4  mm     G. Age:  32w 2d      < 3  %    FL/AC:      19.9   20 - 24
HUM:     57.4  mm     G. Age:  33w 2d       32  %

Est. FW:    7070  gm      5 lb 3 oz     43  %
Gestational Age

LMP:           36w 0d        Date:  04/28/14                 EDD:   02/02/15
U/S Today:     33w 3d                                        EDD:   02/20/15
Best:          35w 0d     Det. By:  Early Ultrasound         EDD:   02/09/15
(07/29/14)
Anatomy

Cranium:          Appears normal         Aortic Arch:      Previously seen
Fetal Cavum:      Appears normal         Ductal Arch:      Previously seen
Ventricles:       Appears normal         Diaphragm:        Appears normal
Choroid Plexus:   Appears normal         Stomach:          Appears normal, left
sided
Cerebellum:       Previously seen        Abdomen:          Appears normal
Posterior Fossa:  Previously seen        Abdominal Wall:   Previously seen
Nuchal Fold:      Not applicable (>20    Cord Vessels:     Previously seen
wks GA)
Face:             Orbits and profile     Kidneys:          Appear normal
previously seen
Lips:             Previously seen        Bladder:          Appears normal
Heart:            Appears normal         Spine:            Previously seen
(4CH, axis, and
situs)
RVOT:             Appears normal         Lower             Previously seen
Extremities:
LVOT:             Not well visualized    Upper             Previously seen
Extremities:

Other:  Fetus appears to be a female. Technically difficult due to advanced
GA and fetal position.
Cervix Uterus Adnexa

Cervix:       Not visualized (advanced GA >87wks)

Left Ovary:    Not visualized.
Right Ovary:   Not visualized.
Adnexa:     No adnexal mass visualized.
Impression

SIUP at 44w3d
-EFW 43rd%
-The HC continues to measure < 3rd %tile (roughly at 2 SD
below the mean for gestatiaonal age)
-The fetal cranial anatomy appears normal
-The femur measures < 5th %tile, but appears normal in
morphology; humerus is 30th%'le and bones demonstrate
normal ossification/mineralization without bowing/fractures -
likely constitutional
-Somewhat limited views of the fetal heart were obtained due
to late gestational age and position
-relatively short maternal stature
-patient declines genetic counseling/testing
-Normal amniotic fluid volume

As above, today's findings and implications were discussed
at bedside and she declined genetic counseling/testing.
Recommendations

Interval growth in 4 weeks if patient remains undelivered.

Thank you for sharing in the care of Ms. MEHHEMED VINAQRADOV with
questions or concerns.

## 2023-03-26 ENCOUNTER — Encounter (HOSPITAL_BASED_OUTPATIENT_CLINIC_OR_DEPARTMENT_OTHER): Payer: Self-pay | Admitting: Emergency Medicine

## 2023-03-26 ENCOUNTER — Other Ambulatory Visit: Payer: Self-pay

## 2023-03-26 ENCOUNTER — Emergency Department (HOSPITAL_BASED_OUTPATIENT_CLINIC_OR_DEPARTMENT_OTHER)
Admission: EM | Admit: 2023-03-26 | Discharge: 2023-03-26 | Disposition: A | Discharge status: Home / Self Care | Payer: No Typology Code available for payment source | Attending: Emergency Medicine | Admitting: Emergency Medicine

## 2023-03-26 DIAGNOSIS — M549 Dorsalgia, unspecified: Secondary | ICD-10-CM | POA: Insufficient documentation

## 2023-03-26 DIAGNOSIS — M542 Cervicalgia: Secondary | ICD-10-CM | POA: Insufficient documentation

## 2023-03-26 DIAGNOSIS — M79645 Pain in left finger(s): Secondary | ICD-10-CM | POA: Diagnosis not present

## 2023-03-26 DIAGNOSIS — Y92410 Unspecified street and highway as the place of occurrence of the external cause: Secondary | ICD-10-CM | POA: Insufficient documentation

## 2023-03-26 MED ORDER — ACETAMINOPHEN 500 MG PO TABS
1000.0000 mg | ORAL_TABLET | Freq: Once | ORAL | Status: AC
  Administered 2023-03-26: 1000 mg via ORAL
  Filled 2023-03-26: qty 2
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
